# Patient Record
Sex: Male | Born: 1970 | Race: White | Hispanic: No | Marital: Married | Smoking: Former smoker
Health system: Southern US, Community
[De-identification: ages and names within clinical notes are randomized; demographics above are authoritative.]

## PROBLEM LIST (undated history)

## (undated) DIAGNOSIS — R079 Chest pain, unspecified: Secondary | ICD-10-CM

## (undated) DIAGNOSIS — F329 Major depressive disorder, single episode, unspecified: Secondary | ICD-10-CM

## (undated) DIAGNOSIS — K219 Gastro-esophageal reflux disease without esophagitis: Secondary | ICD-10-CM

## (undated) DIAGNOSIS — R740 Nonspecific elevation of levels of transaminase and lactic acid dehydrogenase [LDH]: Secondary | ICD-10-CM

## (undated) DIAGNOSIS — E785 Hyperlipidemia, unspecified: Secondary | ICD-10-CM

## (undated) DIAGNOSIS — D376 Neoplasm of uncertain behavior of liver, gallbladder and bile ducts: Secondary | ICD-10-CM

## (undated) DIAGNOSIS — F411 Generalized anxiety disorder: Secondary | ICD-10-CM

## (undated) DIAGNOSIS — J309 Allergic rhinitis, unspecified: Secondary | ICD-10-CM

## (undated) DIAGNOSIS — J45909 Unspecified asthma, uncomplicated: Secondary | ICD-10-CM

## (undated) HISTORY — DX: Nonspecific elevation of levels of transaminase and lactic acid dehydrogenase (ldh): R74.0

## (undated) HISTORY — DX: Major depressive disorder, single episode, unspecified: F32.9

## (undated) HISTORY — DX: Generalized anxiety disorder: F41.1

## (undated) HISTORY — DX: Unspecified asthma, uncomplicated: J45.909

## (undated) HISTORY — DX: Gastro-esophageal reflux disease without esophagitis: K21.9

## (undated) HISTORY — DX: Neoplasm of uncertain behavior of liver, gallbladder and bile ducts: D37.6

## (undated) HISTORY — PX: TONSILLECTOMY: SUR1361

## (undated) HISTORY — DX: Chest pain, unspecified: R07.9

## (undated) HISTORY — DX: Hyperlipidemia, unspecified: E78.5

## (undated) HISTORY — DX: Allergic rhinitis, unspecified: J30.9

---

## 2003-12-29 ENCOUNTER — Ambulatory Visit: Payer: Self-pay | Admitting: Pulmonary Disease

## 2003-12-29 ENCOUNTER — Ambulatory Visit (HOSPITAL_BASED_OUTPATIENT_CLINIC_OR_DEPARTMENT_OTHER): Admission: RE | Admit: 2003-12-29 | Discharge: 2003-12-29 | Payer: Self-pay | Admitting: Internal Medicine

## 2004-01-01 ENCOUNTER — Ambulatory Visit: Payer: Self-pay | Admitting: Internal Medicine

## 2004-01-12 ENCOUNTER — Ambulatory Visit: Payer: Self-pay

## 2004-01-29 ENCOUNTER — Ambulatory Visit: Payer: Self-pay | Admitting: Internal Medicine

## 2007-02-25 ENCOUNTER — Ambulatory Visit: Payer: Self-pay | Admitting: Internal Medicine

## 2007-02-25 LAB — CONVERTED CEMR LAB
ALT: 31 units/L (ref 0–53)
AST: 28 units/L (ref 0–37)
Alkaline Phosphatase: 60 units/L (ref 39–117)
Bilirubin, Direct: 0.1 mg/dL (ref 0.0–0.3)
CO2: 30 meq/L (ref 19–32)
Calcium: 9.1 mg/dL (ref 8.4–10.5)
Creatinine, Ser: 0.9 mg/dL (ref 0.4–1.5)
Eosinophils Relative: 4 % (ref 0.0–5.0)
GFR calc Af Amer: 124 mL/min
HCT: 44.8 % (ref 39.0–52.0)
HDL: 38.1 mg/dL — ABNORMAL LOW (ref >39.0)
Hemoglobin: 15.9 g/dL (ref 13.0–17.0)
Leukocytes, UA: NEGATIVE
Nitrite: NEGATIVE
Platelets: 219 10*3/uL (ref 150–400)
RBC: 4.88 M/uL (ref 4.22–5.81)
Sodium: 142 meq/L (ref 135–145)
TSH: 2.69 microintl units/mL (ref 0.35–5.50)
Total Bilirubin: 0.7 mg/dL (ref 0.3–1.2)
Total CHOL/HDL Ratio: 6.9
Total Protein, Urine: NEGATIVE mg/dL
Total Protein: 6.8 g/dL (ref 6.0–8.3)
Triglycerides: 233 mg/dL (ref 0–149)
VLDL: 47 mg/dL — ABNORMAL HIGH (ref 0–40)
WBC: 7.3 10*3/uL (ref 4.5–10.5)

## 2007-02-28 ENCOUNTER — Ambulatory Visit: Payer: Self-pay | Admitting: Internal Medicine

## 2007-02-28 DIAGNOSIS — F3289 Other specified depressive episodes: Secondary | ICD-10-CM

## 2007-02-28 DIAGNOSIS — F411 Generalized anxiety disorder: Secondary | ICD-10-CM

## 2007-02-28 DIAGNOSIS — F329 Major depressive disorder, single episode, unspecified: Secondary | ICD-10-CM | POA: Insufficient documentation

## 2007-02-28 HISTORY — DX: Generalized anxiety disorder: F41.1

## 2007-02-28 HISTORY — DX: Other specified depressive episodes: F32.89

## 2007-02-28 HISTORY — DX: Major depressive disorder, single episode, unspecified: F32.9

## 2008-05-08 ENCOUNTER — Ambulatory Visit: Payer: Self-pay | Admitting: Internal Medicine

## 2008-05-08 DIAGNOSIS — E785 Hyperlipidemia, unspecified: Secondary | ICD-10-CM

## 2008-05-08 HISTORY — DX: Hyperlipidemia, unspecified: E78.5

## 2008-07-24 ENCOUNTER — Ambulatory Visit: Payer: Self-pay | Admitting: Internal Medicine

## 2008-07-24 DIAGNOSIS — R079 Chest pain, unspecified: Secondary | ICD-10-CM

## 2008-07-24 DIAGNOSIS — J45909 Unspecified asthma, uncomplicated: Secondary | ICD-10-CM

## 2008-07-24 DIAGNOSIS — K219 Gastro-esophageal reflux disease without esophagitis: Secondary | ICD-10-CM

## 2008-07-24 DIAGNOSIS — J309 Allergic rhinitis, unspecified: Secondary | ICD-10-CM

## 2008-07-24 HISTORY — DX: Chest pain, unspecified: R07.9

## 2008-07-24 HISTORY — DX: Unspecified asthma, uncomplicated: J45.909

## 2008-07-24 HISTORY — DX: Allergic rhinitis, unspecified: J30.9

## 2008-07-24 HISTORY — DX: Gastro-esophageal reflux disease without esophagitis: K21.9

## 2008-07-24 LAB — CONVERTED CEMR LAB
Calcium: 9.3 mg/dL (ref 8.4–10.5)
Cholesterol: 239 mg/dL — ABNORMAL HIGH (ref 0–200)
Eosinophils Relative: 2.6 % (ref 0.0–5.0)
GFR calc non Af Amer: 116.61 mL/min (ref >60)
HDL: 45 mg/dL (ref >39.00)
Lymphocytes Relative: 33.5 % (ref 12.0–46.0)
Lymphs Abs: 1.9 10*3/uL (ref 0.7–4.0)
MCV: 92.1 fL (ref 78.0–100.0)
Monocytes Absolute: 0.7 10*3/uL (ref 0.1–1.0)
Neutro Abs: 3 10*3/uL (ref 1.4–7.7)
RBC: 4.98 M/uL (ref 4.22–5.81)
Sodium: 141 meq/L (ref 135–145)
Specific Gravity, Urine: >=1.03 (ref 1.000–1.030)
TSH: 1.49 microintl units/mL (ref 0.35–5.50)
Total Protein: 7.2 g/dL (ref 6.0–8.3)
Urine Glucose: NEGATIVE mg/dL
VLDL: 16 mg/dL (ref 0.0–40.0)
WBC: 5.8 10*3/uL (ref 4.5–10.5)
pH: 5.5 (ref 5.0–8.0)

## 2009-04-12 ENCOUNTER — Telehealth: Payer: Self-pay | Admitting: Internal Medicine

## 2009-04-15 ENCOUNTER — Telehealth: Payer: Self-pay | Admitting: Internal Medicine

## 2009-05-14 ENCOUNTER — Ambulatory Visit: Payer: Self-pay | Admitting: Internal Medicine

## 2009-05-14 LAB — CONVERTED CEMR LAB
ALT: 104 units/L — ABNORMAL HIGH (ref 0–53)
Alkaline Phosphatase: 58 units/L (ref 39–117)
Basophils Absolute: 0 10*3/uL (ref 0.0–0.1)
Bilirubin Urine: NEGATIVE
Chloride: 108 meq/L (ref 96–112)
Creatinine, Ser: 0.8 mg/dL (ref 0.4–1.5)
Direct LDL: 213.8 mg/dL
Glucose, Bld: 96 mg/dL (ref 70–99)
HDL: 43 mg/dL (ref >39.00)
Hemoglobin, Urine: NEGATIVE
Hemoglobin: 15.7 g/dL (ref 13.0–17.0)
Ketones, ur: NEGATIVE mg/dL
Leukocytes, UA: NEGATIVE
MCV: 94.8 fL (ref 78.0–100.0)
Nitrite: NEGATIVE
Potassium: 4.2 meq/L (ref 3.5–5.1)
RDW: 12.1 % (ref 11.5–14.6)
Sodium: 142 meq/L (ref 135–145)
Specific Gravity, Urine: 1.025 (ref 1.000–1.030)
Total Protein, Urine: NEGATIVE mg/dL
Total Protein: 7.7 g/dL (ref 6.0–8.3)
Triglycerides: 164 mg/dL — ABNORMAL HIGH (ref 0.0–149.0)
Urine Glucose: NEGATIVE mg/dL
Urobilinogen, UA: 0.2 (ref 0.0–1.0)
VLDL: 32.8 mg/dL (ref 0.0–40.0)
WBC: 6.1 10*3/uL (ref 4.5–10.5)
pH: 5 (ref 5.0–8.0)

## 2009-05-17 LAB — CONVERTED CEMR LAB
Hep B C IgM: NEGATIVE
Hepatitis B Surface Ag: NEGATIVE

## 2009-05-18 ENCOUNTER — Ambulatory Visit: Payer: Self-pay | Admitting: Internal Medicine

## 2009-05-18 DIAGNOSIS — R740 Nonspecific elevation of levels of transaminase and lactic acid dehydrogenase [LDH]: Secondary | ICD-10-CM

## 2009-05-18 DIAGNOSIS — R7402 Elevation of levels of lactic acid dehydrogenase (LDH): Secondary | ICD-10-CM

## 2009-05-18 DIAGNOSIS — R7401 Elevation of levels of liver transaminase levels: Secondary | ICD-10-CM

## 2009-05-18 HISTORY — DX: Elevation of levels of lactic acid dehydrogenase (LDH): R74.02

## 2009-05-18 HISTORY — DX: Elevation of levels of liver transaminase levels: R74.01

## 2009-05-25 ENCOUNTER — Encounter: Payer: Self-pay | Admitting: Internal Medicine

## 2009-05-25 ENCOUNTER — Encounter: Admission: RE | Admit: 2009-05-25 | Discharge: 2009-05-25 | Payer: Self-pay | Admitting: Internal Medicine

## 2009-05-25 DIAGNOSIS — D376 Neoplasm of uncertain behavior of liver, gallbladder and bile ducts: Secondary | ICD-10-CM

## 2009-05-25 HISTORY — DX: Neoplasm of uncertain behavior of liver, gallbladder and bile ducts: D37.6

## 2009-06-23 ENCOUNTER — Telehealth: Payer: Self-pay | Admitting: Internal Medicine

## 2009-06-23 ENCOUNTER — Ambulatory Visit: Payer: Self-pay | Admitting: Internal Medicine

## 2009-06-23 LAB — CONVERTED CEMR LAB
LDL Cholesterol: 142 mg/dL — ABNORMAL HIGH (ref 0–99)
Total Bilirubin: 0.8 mg/dL (ref 0.3–1.2)
Total CHOL/HDL Ratio: 5
Triglycerides: 101 mg/dL (ref 0.0–149.0)

## 2009-07-12 ENCOUNTER — Ambulatory Visit: Payer: Self-pay | Admitting: Internal Medicine

## 2010-03-13 ENCOUNTER — Encounter: Payer: Self-pay | Admitting: Internal Medicine

## 2010-03-22 NOTE — Progress Notes (Signed)
Summary: REQ FOR RX  Phone Note Call from Patient Call back at Home Phone [phone]   Summary of Call: Patient is requesting rx for "jitters" for trip. He will be flying out Tuesday.  Initial call taken by: Lamar Sprinkles, CMA,  April 12, 2009 6:18 PM  Follow-up for Phone Call        I am unable to help as I am out of the office for this type of problem;  please forward to MD in the office next available Follow-up by: Corwin Levins MD,  April 12, 2009 6:29 PM  Additional Follow-up for Phone Call Additional follow up Details #1::        i can write for limited Klonopin if pt still able to pick up at office today - please check with him now & let him know to come by if he has time this AM to get rx - thanks! Additional Follow-up by: Newt Lukes MD,  April 13, 2009 8:48 AM    Additional Follow-up for Phone Call Additional follow up Details #2::    left mess to call office back.....................Marland KitchenLamar Sprinkles, CMA  April 13, 2009 12:14 PM   No return call, pt was going to fly out today. Closing phone note, will address if pt calls back.  Follow-up by: Lamar Sprinkles, CMA,  April 13, 2009 5:16 PM

## 2010-03-22 NOTE — Progress Notes (Signed)
Summary: pt?  Phone Note Call from Patient Call back at Home Phone [phone]   Caller: Patient Summary of Call: pt called stating that he receive JWJ message on PT. Pt would like to know if his liver function is good enough for him to "drink a couple beers without worring?". please advise Initial call taken by: Margaret Pyle, CMA,  Jun 23, 2009 2:35 PM  Follow-up for Phone Call        sure, no problem with this, but always drink in moderation Follow-up by: Corwin Levins MD,  Jun 23, 2009 3:22 PM  Additional Follow-up for Phone Call Additional follow up Details #1::        pt informed and Labs mailed per pt request Additional Follow-up by: Margaret Pyle, CMA,  Jun 23, 2009 3:51 PM

## 2010-03-22 NOTE — Assessment & Plan Note (Signed)
Summary: PER DAHLIA SCHED--PER PT D/T--CHEST PAIN--STC   Vital Signs:  Patient profile:   40 year old male Height:      73 inches Weight:      250 pounds BMI:     33.10 O2 Sat:      98 % on Room air Temp:     97.7 degrees F oral Pulse rate:   69 / minute BP sitting:   122 / 88  (left arm) Cuff size:   large  Vitals Entered ByZella Ball Ewing (Jul 12, 2009 1:34 PM)  O2 Flow:  Room air CC: Chest pain/RE   CC:  Chest pain/RE.  History of Present Illness: here with sister in law; Pt has been trying to reduce his paxil from his chronic 10 mg to 5 mg since march 2011;  unfortunately with increased anxiety, chest "fullness" and diffuse pressure with shaking, tremulous, and although had no side effect with the 10 mg that concerned him, seems unable to decide to increase the paxil back to the 10 mg;  requiring lots of family reassurance, near severe symptom today and requiring lots of persistent family reassurance today;  denies worsening depressive symptoms or suicidal ideation; has not had "full blown" panic attack enought to go to ER; doesnot feel he needs counseling at this time.    Pt denies other CP, cough, fever , sob, doe, wheezing, orthopnea, pnd, worsening LE edema, palps, dizziness or syncope  - walks 4 miles per day;  does ellipitical to Heart rate  160 for 45 min to  1 HR without difficulty;  still hard to lose wt.  Pt denies new neuro symptoms such as headache, facial or extremity weakness Overall o/w good compliance with meds, good tolerance.    Problems Prior to Update: 1)  Liver Mass  (ICD-235.3) 2)  Transaminases, Serum, Elevated  (ICD-790.4) 3)  Gerd  (ICD-530.81) 4)  Asthma  (ICD-493.90) 5)  Allergic Rhinitis  (ICD-477.9) 6)  Chest Pain  (ICD-786.50) 7)  Preventive Health Care  (ICD-V70.0) 8)  Hyperlipidemia  (ICD-272.4) 9)  Preventive Health Care  (ICD-V70.0) 10)  Depression  (ICD-311) 11)  Anxiety  (ICD-300.00) 12)  Routine General Medical Exam@health  Care Facl   (ICD-V70.0)  Medications Prior to Update: 1)  Paroxetine Hcl 10 Mg Tabs (Paroxetine Hcl) .Marland Kitchen.. 1 By Mouth Once Daily 2)  Clonazepam 0.5 Mg Tabs (Clonazepam) .Marland Kitchen.. 1 By Mouth Two Times A Day As Needed 3)  Lipitor 20 Mg Tabs (Atorvastatin Calcium) .Marland Kitchen.. 1po Once Daily  Current Medications (verified): 1)  Paroxetine Hcl 10 Mg Tabs (Paroxetine Hcl) .Marland Kitchen.. 1 By Mouth Once Daily 2)  Clonazepam 0.5 Mg Tabs (Clonazepam) .Marland Kitchen.. 1 By Mouth Two Times A Day As Needed 3)  Lipitor 20 Mg Tabs (Atorvastatin Calcium) .Marland Kitchen.. 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   Head:  normocephalic and atraumatic.   Eyes:  vision grossly intact, pupils equal, and pupils round.   Ears:  R ear normal and L ear normal.   Nose:  no external deformity and no nasal discharge.   Mouth:  no gingival abnormalities and pharynx pink and moist.   Neck:  supple and no masses.   Lungs:  normal respiratory effort and normal breath sounds.   Heart:  normal rate and regular rhythm.   Abdomen:  soft, non-tender, and normal bowel sounds.   Extremities:  no edema, no erythema    Impression & Recommendations:  Problem # 1:  CHEST PAIN (ICD-786.50) doubt cardiac,  will check cxr ,  but most likely related to MSK and/or anxiety;  for tylenol as needed  Orders: EKG w/ Interpretation (93000) T-2 View CXR, Same Day  (71020.5TC)  Problem # 2:  ANXIETY (ICD-300.00)  His updated medication list for this problem includes:    Paroxetine Hcl 10 Mg Tabs (Paroxetine hcl) .Marland Kitchen... 1 by mouth once daily    Clonazepam 0.5 Mg Tabs (Clonazepam) .Marland Kitchen... 1 by mouth two times a day as needed pt strongly encouraged to change back to the 10 mg paxil, to which he agrees;  treat as above, f/u any worsening signs or symptoms , consider counseling or psychiatry   Problem # 3:  ASTHMA (ICD-493.90) stable overall by hx and exam, ok to continue meds/tx as is  - does not require med at this time  Complete Medication List: 1)  Paroxetine Hcl 10 Mg Tabs (Paroxetine hcl) .Marland Kitchen.. 1 by mouth once daily 2)  Clonazepam 0.5 Mg Tabs (Clonazepam) .Marland Kitchen.. 1 by mouth two times a day as needed 3)  Lipitor 20 Mg Tabs (Atorvastatin calcium) .Marland Kitchen.. 1po once daily  Patient Instructions: 1)  Your EKG was normal today 2)  Please go to Radiology in the basement level for your X-Ray today  3)  increase the paxil to 10 mg per day 4)  Please schedule a follow-up appointment as needed.

## 2010-03-22 NOTE — Miscellaneous (Signed)
Summary: Orders Update   Clinical Lists Changes  Problems: Added new problem of LIVER MASS (ICD-235.3) Orders: Added new Referral order of Radiology Referral (Radiology) - Signed 

## 2010-03-22 NOTE — Progress Notes (Signed)
Summary: Req for rx  Phone Note Call from Patient Call back at [facility] Phone [phone]   Summary of Call: Pt returned call and req rx for trip again, *see previous phone note*. Left mess for pt to call office back.  Initial call taken by: Lamar Sprinkles, CMA,  April 15, 2009 3:41 PM  Follow-up for Phone Call        Pt has not left town and would like rx called into [facility]. See previous message, Dr Felicity Coyer was going to [facility].  Follow-up by: Lamar Sprinkles, CMA,  April 15, 2009 3:48 PM  Additional Follow-up for Phone Call Additional follow up Details #1::        ok for klonopin - done hardcopy to LIM side B - dahlia  Additional Follow-up by: Corwin Levins MD,  April 15, 2009 5:14 PM    Additional Follow-up for Phone Call Additional follow up Details #2::    rx faxed to pharmacy, pt informed via personal VM Follow-up by: Margaret Pyle, CMA,  April 16, 2009 9:13 AM  New/Updated Medications: CLONAZEPAM 0.5 MG TABS (CLONAZEPAM) 1 by mouth two times a day as needed Prescriptions: CLONAZEPAM 0.5 MG TABS (CLONAZEPAM) 1 by mouth two times a day as needed  #30 x 0   Entered and Authorized by:   Corwin Levins MD   Signed by:   Corwin Levins MD on 04/15/2009   Method used:   Print then Give to Patient   RxID:   [id number]

## 2010-03-22 NOTE — Assessment & Plan Note (Signed)
Summary: CPX / [facility]/ #/ CD   Vital Signs:  Patient profile:   40 year old male Height:      73 inches Weight:      254.25 pounds BMI:     33.67 O2 Sat:      98 % on Room air Temp:     98.3 degrees F oral Pulse rate:   72 / minute BP sitting:   100 / 62  (left arm) Cuff size:   large  Vitals Entered ByZella Ball Ewing (May 18, 2009 10:53 AM)  O2 Flow:  Room air  CC: Adult Physical/RE   CC:  Adult Physical/RE.  History of Present Illness: overall doing well, no compalints, wants to try to wean off the paxil - no depressive symtpom, suicidal ideation and stress level OK now, no panic;  has some fatigue but no OSA symptoms. Pt denies CP, sob, doe, wheezing, orthopnea, pnd, worsening LE edema, palps, dizziness or syncope  Pt denies new neuro symptoms such as headache, facial or extremity weakness   Problems Prior to Update: 1)  Gerd  (ICD-530.81) 2)  Asthma  (ICD-493.90) 3)  Allergic Rhinitis  (ICD-477.9) 4)  Chest Pain  (ICD-786.50) 5)  Cerebrovascular Disease  (ICD-437.9) 6)  Preventive Health Care  (ICD-V70.0) 7)  Hyperlipidemia  (ICD-272.4) 8)  Preventive Health Care  (ICD-V70.0) 9)  Depression  (ICD-311) 10)  Anxiety  (ICD-300.00) 11)  Routine General Medical Exam@health  Care Facl  (ICD-V70.0)  Medications Prior to Update: 1)  Paroxetine Hcl 20 Mg  Tabs (Paroxetine Hcl) .Marland Kitchen.. 1 By Mouth Once Daily 2)  Clonazepam 0.5 Mg Tabs (Clonazepam) .Marland Kitchen.. 1 By Mouth Two Times A Day As Needed  Current Medications (verified): 1)  Paroxetine Hcl 10 Mg Tabs (Paroxetine Hcl) .Marland Kitchen.. 1 By Mouth Once Daily 2)  Clonazepam 0.5 Mg Tabs (Clonazepam) .Marland Kitchen.. 1 By Mouth Two Times A Day As Needed 3)  Lipitor 20 Mg Tabs (Atorvastatin Calcium) .Marland Kitchen.. 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 Anxiety/Panic disorder Depression Hyperlipidemia mild MR by echo 11/05 - EF normal Allergic rhinitis Asthma GERD OSA - mild  Past Surgical History: Reviewed history from 02/28/2007 and no changes required. Tonsillectomy  Review of Systems  The patient denies anorexia, fever, weight loss, vision loss, decreased hearing, hoarseness, chest pain, syncope, dyspnea on exertion, peripheral edema, prolonged cough, headaches, hemoptysis, abdominal pain, melena, hematochezia, severe indigestion/heartburn, hematuria, muscle weakness, suspicious skin lesions, transient blindness, difficulty walking, depression, unusual weight change, abnormal bleeding, enlarged lymph nodes, and angioedema.         all otherwise negative per pt -    Physical Exam  General:  alert and overweight-appearing.   Head:  normocephalic and atraumatic.   Eyes:  vision grossly intact, pupils equal, and pupils round.   Ears:  R ear normal and L ear normal.   Nose:  no external deformity and no nasal discharge.   Mouth:  no gingival abnormalities and pharynx pink and moist.   Neck:  supple and no masses.   Lungs:  normal breath sounds.   Heart:  normal rate and regular rhythm.  Abdomen:  soft, non-tender, and normal bowel sounds.   Msk:  no joint tenderness and no joint swelling.   Extremities:  no edema, no erythema  Neurologic:  cranial nerves II-XII intact and strength normal in all extremities.     Impression & Recommendations:  Problem # 1:  Preventive Health Care (ICD-V70.0)  Overall doing well, age appropriate education and counseling updated and referral for appropriate preventive services done unless declined, immunizations up to date or declined,  diet counseling done if overweight, urged to quit smoking if smokes , most recent labs reviewed and current ordered if appropriate, ecg reviewed or declined (interpretation per ECG scanned in the EMR if done); information regarding Medicare Prevention requirements given if appropriate   Orders: EKG w/ Interpretation (93000)  Problem # 2:  TRANSAMINASES, SERUM, ELEVATED (ICD-790.4)  for u/s f/u, hep serology neg; consider further w/u   Orders: Radiology Referral (Radiology)  Problem # 3:  HYPERLIPIDEMIA (ICD-272.4)  to start the lipitor 20 mg ;  f/u labs 4 wks  His updated medication list for this problem includes:    Lipitor 20 Mg Tabs (Atorvastatin calcium) .Marland Kitchen... 1po once daily  Labs Reviewed: SGOT: 70 (05/14/2009)   SGPT: 104 (05/14/2009)   HDL:43.00 (05/14/2009), 45.00 (07/24/2008)  LDL:DEL (02/25/2007)  Chol:262 (05/14/2009), 239 (07/24/2008)  Trig:164.0 (05/14/2009), 80.0 (07/24/2008)  Problem # 4:  DEPRESSION (ICD-311)  His updated medication list for this problem includes:    Paroxetine Hcl 10 Mg Tabs (Paroxetine hcl) .Marland Kitchen... 1 by mouth once daily    Clonazepam 0.5 Mg Tabs (Clonazepam) .Marland Kitchen... 1 by mouth two times a day as needed improved, wants to try wean off the paxil., Continue all other previous medications as before this visit   Complete Medication List: 1)  Paroxetine Hcl 10 Mg Tabs (Paroxetine hcl) .Marland Kitchen.. 1 by mouth once daily 2)  Clonazepam 0.5 Mg Tabs (Clonazepam) .Marland Kitchen.. 1 by mouth two times a day as needed 3)  Lipitor 20 Mg Tabs (Atorvastatin calcium) .Marland Kitchen.. 1po once daily  Patient Instructions: 1)  You will be contacted about the referral(s) to: ultrasound for the liver 2)  start the liptor at 20 mg per day 3)  please return for lab only in 4 wks;  4)  Hepatic Panel prior to visit, ICD-9: v58.69 5)  Lipid Panel prior to visit, ICD-9: 272.0 6)  Continue all previous medications as before this visit , except you can try to wean off the paxil as you have planned 7)   Please schedule a follow-up appointment in 1 year or sooner if needed Prescriptions: CLONAZEPAM 0.5 MG TABS (CLONAZEPAM) 1 by mouth two times a day as needed  #60 x 2   Entered and Authorized by:   Corwin Levins MD   Signed by:   Corwin Levins MD on 05/18/2009   Method used:   Print then Give to Patient   RxID:   [id number] PAROXETINE HCL 10 MG TABS (PAROXETINE HCL) 1 by mouth once daily  #90 x 3   Entered and Authorized by:   Corwin Levins MD   Signed by:   Corwin Levins MD on 05/18/2009   Method used:   Electronically to        [facility] (retail)       [address]       [place], [place]  [id number]       Ph: [phone] or [phone]       Fax: [phone]   RxID:  [id number] LIPITOR 20 MG TABS (ATORVASTATIN CALCIUM) 1po once daily  #90 x 3   Entered and Authorized by:   Corwin Levins MD   Signed by:   Corwin Levins MD on 05/18/2009   Method used:   Electronically to        [facility] (retail)       [address]       [place], [place]  [id number]       Ph: [phone] or [phone]       Fax: [phone]   RxID:   [id number]

## 2010-05-31 ENCOUNTER — Other Ambulatory Visit: Payer: Self-pay

## 2010-05-31 MED ORDER — ATORVASTATIN CALCIUM 20 MG PO TABS: 20.0000 mg | ORAL_TABLET | Freq: Every day | ORAL | Status: DC

## 2010-05-31 NOTE — Telephone Encounter (Signed)
Pt called requesting refill of Lipitor. Pt advised via VM that CPX will be needed for further refill, only limited supply authorized.

## 2010-06-02 ENCOUNTER — Other Ambulatory Visit: Payer: Self-pay | Admitting: Internal Medicine

## 2010-06-02 NOTE — Telephone Encounter (Signed)
To robin   

## 2010-06-03 MED ORDER — ATORVASTATIN CALCIUM 20 MG PO TABS: 20.0000 mg | ORAL_TABLET | Freq: Every day | ORAL | Status: DC

## 2010-06-03 NOTE — Telephone Encounter (Signed)
Called patient informed of prescription refill and the need to schedule yearly. Patient agreed to schedule appt. And did so. Also patient requested a refill on his Lipitor.

## 2010-06-03 NOTE — Telephone Encounter (Signed)
Ok for refill x 3 mo  Please ask pt to return soon - due for yearly in may 2012

## 2010-07-08 NOTE — Procedures (Signed)
NAME:  Sean Johnson, Sean Johnson NO.:  [id number]   MEDICAL RECORD NO.:  [id number]          PATIENT TYPE:  OUT   LOCATION:  SLEEP CENTER                 FACILITY:  [facility]   PHYSICIAN:  Marcelyn Bruins, M.D. [facility] DATE OF BIRTH:  [birth year]   DATE OF STUDY:  12/29/2003                              NOCTURNAL POLYSOMNOGRAM   REFERRING PHYSICIAN:  Dr. Sandrea Hughs.   INDICATION FOR THE STUDY:  Hypersomnia with sleep apnea.   SLEEP ARCHITECTURE:  The patient had a total sleep time of 344 minutes with  a sleep efficiency of 79%.  There was decreased REM and slow wave sleep.  Sleep onset latency was prolonged at 91 minutes, as was REM latency at 212  minutes.   IMPRESSION:  1.  Mild obstructive sleep apnea/hypopnea syndrome with a respiratory      disturbance index of 17 events per hour and oxygen desaturation as low      as 85%.  Events were not positional.  2.  Moderate to loud snoring noted throughout the study.  3.  Occasional premature atrial contraction that was not clinically      significant.  4.  Moderate numbers of leg jerks with very mild sleep disruption.      KC/MEDQ  D:  01/13/2004 16:36:57  T:  01/13/2004 16:57:00  Job:  [id number]

## 2010-08-16 ENCOUNTER — Other Ambulatory Visit: Payer: Self-pay | Admitting: Internal Medicine

## 2010-08-16 ENCOUNTER — Other Ambulatory Visit: Payer: Self-pay

## 2010-08-16 DIAGNOSIS — Z0000 Encounter for general adult medical examination without abnormal findings: Secondary | ICD-10-CM

## 2010-08-18 ENCOUNTER — Other Ambulatory Visit (INDEPENDENT_AMBULATORY_CARE_PROVIDER_SITE_OTHER): Payer: Self-pay

## 2010-08-18 ENCOUNTER — Other Ambulatory Visit: Payer: Self-pay

## 2010-08-18 ENCOUNTER — Other Ambulatory Visit: Payer: Self-pay | Admitting: Internal Medicine

## 2010-08-18 DIAGNOSIS — Z0000 Encounter for general adult medical examination without abnormal findings: Secondary | ICD-10-CM

## 2010-08-18 LAB — LIPID PANEL
Total CHOL/HDL Ratio: 4
Triglycerides: 168 mg/dL — ABNORMAL HIGH (ref 0.0–149.0)

## 2010-08-18 LAB — URINALYSIS
Hgb urine dipstick: NEGATIVE
Ketones, ur: NEGATIVE
Leukocytes, UA: NEGATIVE
Specific Gravity, Urine: 1.02 (ref 1.000–1.030)
Urine Glucose: NEGATIVE
Urobilinogen, UA: 0.2 (ref 0.0–1.0)
pH: 7 (ref 5.0–8.0)

## 2010-08-18 LAB — HEPATIC FUNCTION PANEL
ALT: 57 U/L — ABNORMAL HIGH (ref 0–53)
Alkaline Phosphatase: 67 U/L (ref 39–117)
Bilirubin, Direct: 0.2 mg/dL (ref 0.0–0.3)
Total Protein: 7.6 g/dL (ref 6.0–8.3)

## 2010-08-18 LAB — CBC WITH DIFFERENTIAL/PLATELET
Eosinophils Relative: 1.9 % (ref 0.0–5.0)
HCT: 44.8 % (ref 39.0–52.0)
Lymphocytes Relative: 36.1 % (ref 12.0–46.0)
MCV: 92.4 fl (ref 78.0–100.0)
Monocytes Absolute: 0.7 10*3/uL (ref 0.1–1.0)
Neutro Abs: 3.4 10*3/uL (ref 1.4–7.7)
Neutrophils Relative %: 51.2 % (ref 43.0–77.0)
Platelets: 210 10*3/uL (ref 150.0–400.0)
RDW: 12.7 % (ref 11.5–14.6)
WBC: 6.6 10*3/uL (ref 4.5–10.5)

## 2010-08-18 LAB — BASIC METABOLIC PANEL
CO2: 26 mEq/L (ref 19–32)
GFR: 157.63 mL/min (ref >60.00)

## 2010-08-23 ENCOUNTER — Encounter: Payer: Self-pay | Admitting: Internal Medicine

## 2010-08-23 ENCOUNTER — Ambulatory Visit (INDEPENDENT_AMBULATORY_CARE_PROVIDER_SITE_OTHER): Payer: 59 | Admitting: Internal Medicine

## 2010-08-23 VITALS — BP 112/78 | HR 80 | Temp 98.0°F | Resp 14 | Ht 73.25 in | Wt 249.0 lb

## 2010-08-23 DIAGNOSIS — R209 Unspecified disturbances of skin sensation: Secondary | ICD-10-CM

## 2010-08-23 DIAGNOSIS — R202 Paresthesia of skin: Secondary | ICD-10-CM | POA: Insufficient documentation

## 2010-08-23 DIAGNOSIS — Z136 Encounter for screening for cardiovascular disorders: Secondary | ICD-10-CM

## 2010-08-23 DIAGNOSIS — Z0000 Encounter for general adult medical examination without abnormal findings: Secondary | ICD-10-CM

## 2010-08-23 MED ORDER — PAROXETINE HCL 10 MG PO TABS: 10.0000 mg | ORAL_TABLET | ORAL | Status: DC

## 2010-08-23 MED ORDER — ATORVASTATIN CALCIUM 20 MG PO TABS: 20.0000 mg | ORAL_TABLET | Freq: Every day | ORAL | Status: DC

## 2010-08-23 NOTE — Progress Notes (Signed)
Subjective:    Patient ID: Sean Johnson, male    DOB: Jan 20, 1971, 40 y.o.   MRN: [id number]  HPI  Here for wellness and f/u;  Overall doing ok;  Pt denies CP, worsening SOB, DOE, wheezing, orthopnea, PND, worsening LE edema, palpitations, dizziness or syncope.  Pt denies neurological change such as new Headache, facial or extremity weakness.  Pt denies polydipsia, polyuria, or low sugar symptoms. Pt states overall good compliance with treatment and medications, good tolerability, and trying to follow lower cholesterol diet.  Pt denies worsening depressive symptoms, suicidal ideation or panic. No fever, wt loss, night sweats, loss of appetite, or other constitutional symptoms.  Pt states good ability with ADL's, low fall risk, home safety reviewed and adequate, no significant changes in hearing or vision, and occasionally active with exercise, too hot to run lately.  C/o LUE pain, mostly at the elbow, some at the shoulder, mild overall, ? Mild worse with sleeping on the left side, ongoing for 6-7 mo, some numbness,, is right handed, no weakeness overall and no neck pain. Past Medical History  Diagnosis Date  . ALLERGIC RHINITIS 07/24/2008  . ANXIETY 02/28/2007  . ASTHMA 07/24/2008  . CHEST PAIN 07/24/2008  . DEPRESSION 02/28/2007  . GERD 07/24/2008  . HYPERLIPIDEMIA 05/08/2008  . LIVER MASS 05/25/2009  . TRANSAMINASES, SERUM, ELEVATED 05/18/2009   Past Surgical History  Procedure Date  . Tonsillectomy     reports that he has quit smoking. He does not have any smokeless tobacco history on file. He reports that he drinks alcohol. His drug history not on file. family history includes Alcohol abuse in his other; Cancer in his mother; Depression in his other; and Stroke in his other. No Known Allergies Current Outpatient Prescriptions on File Prior to Visit  Medication Sig Dispense Refill  . atorvastatin (LIPITOR) 20 MG tablet Take 1 tablet (20 mg total) by mouth daily.  30 tablet  3  . PARoxetine  (PAXIL) 10 MG tablet TAKE ONE TABLET EVERY DAY  31 tablet  3  . clonazePAM (KLONOPIN) 0.5 MG tablet Take 0.5 mg by mouth 2 (two) times daily as needed.         Review of Systems Review of Systems  Constitutional: Negative for diaphoresis, activity change, appetite change and unexpected weight change.  HENT: Negative for hearing loss, ear pain, facial swelling, mouth sores and neck stiffness.   Eyes: Negative for pain, redness and visual disturbance.  Respiratory: Negative for shortness of breath and wheezing.   Cardiovascular: Negative for chest pain and palpitations.  Gastrointestinal: Negative for diarrhea, blood in stool, abdominal distention and rectal pain.  Genitourinary: Negative for hematuria, flank pain and decreased urine volume.  Musculoskeletal: Negative for myalgias and joint swelling.  Skin: Negative for color change and wound.  Neurological: Negative for syncope and numbness.  Hematological: Negative for adenopathy.  Psychiatric/Behavioral: Negative for hallucinations, self-injury, decreased concentration and agitation.      Objective:   Physical Exam BP 112/78  Pulse 80  Temp(Src) 98 F (36.7 C) (Oral)  Resp 14  Ht 6' 1.25" (1.861 m)  Wt 249 lb (112.946 kg)  BMI 32.63 kg/m2  SpO2 97% Physical Exam  VS noted Constitutional: Pt is oriented to person, place, and time. Appears well-developed and well-nourished.  Head: Normocephalic and atraumatic.  Right Ear: External ear normal.  Left Ear: External ear normal.  Nose: Nose normal.  Mouth/Throat: Oropharynx is clear and moist.  Eyes: Conjunctivae and EOM are normal. Pupils are equal,  round, and reactive to light.  Neck: Normal range of motion. Neck supple. No JVD present. No tracheal deviation present.  Cardiovascular: Normal rate, regular rhythm, normal heart sounds and intact distal pulses.   Pulmonary/Chest: Effort normal and breath sounds normal.  Abdominal: Soft. Bowel sounds are normal. There is no  tenderness.  Musculoskeletal: Normal range of motion. Exhibits no edema.  Lymphadenopathy:  Has no cervical adenopathy.  Neurological: Pt is alert and oriented to person, place, and time. Pt has normal reflexes. No cranial nerve deficit.  Skin: Skin is warm and dry. No rash noted.  Psychiatric:  Has  normal mood and affect. Behavior is normal.         Assessment & Plan:

## 2010-08-23 NOTE — Assessment & Plan Note (Signed)

## 2010-08-23 NOTE — Assessment & Plan Note (Signed)
Overall mild, exam ok, ? Mild ulnar neuritis vs CTS - declines other specific eval at this time such as emg/ncs

## 2010-08-23 NOTE — Patient Instructions (Signed)
Continue all other medications as before Please return in 1 year for your yearly visit, or sooner if needed, with Lab testing done 3-5 days before  

## 2011-05-10 IMAGING — US US ABDOMEN COMPLETE
1 series · 13 of 25 positions shown · non-contrast
Comparison: None.

CLINICAL DATA: Elevated liver function tests

ABDOMINAL ULTRASOUND COMPLETE

[Series 1: us abdomen complete · 0.29mm/px · 13 of 81 slices shown]
[im 1/81]
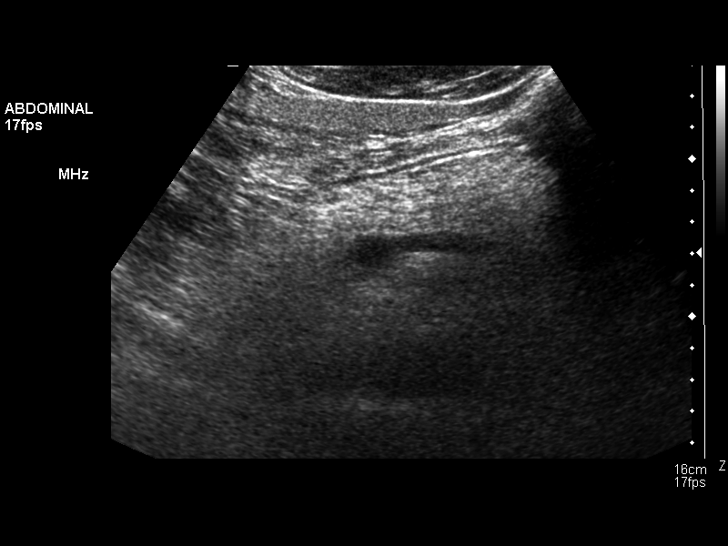
[im 7/81]
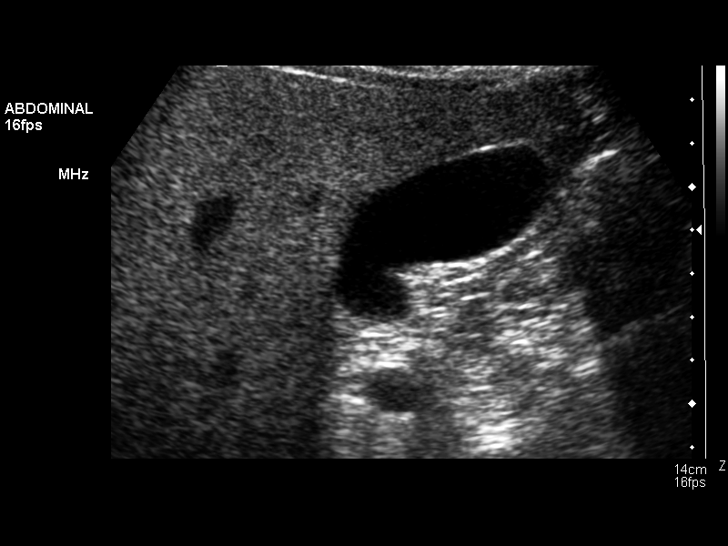
[im 14/81]
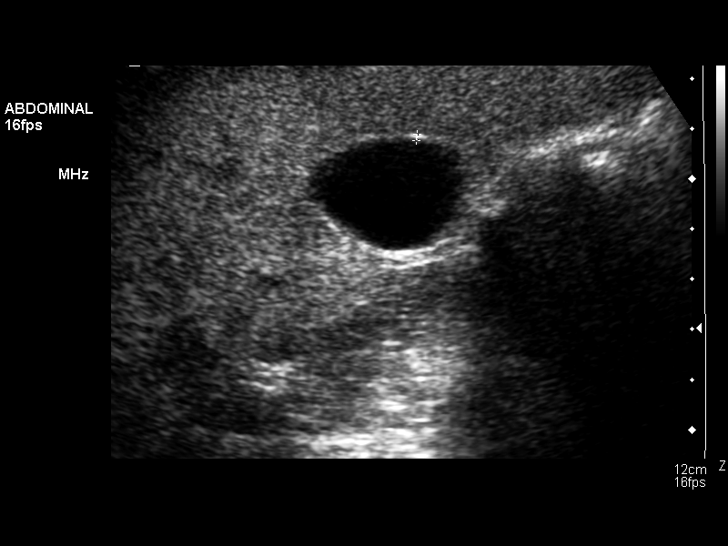
[im 21/81]
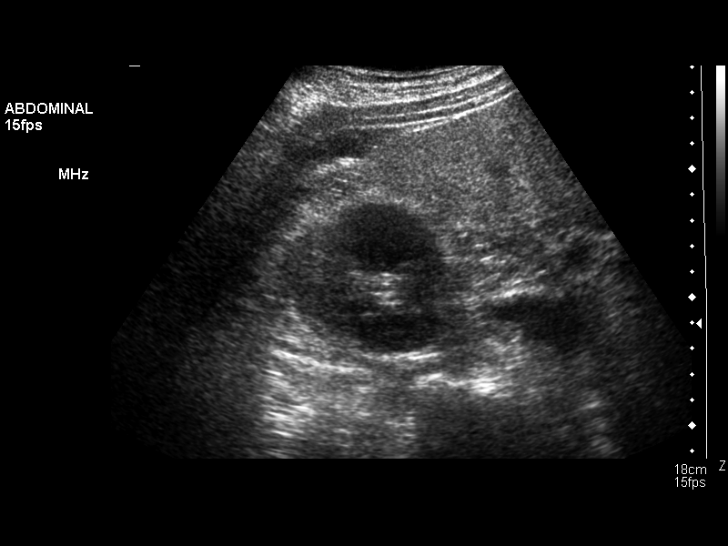
[im 27/81]
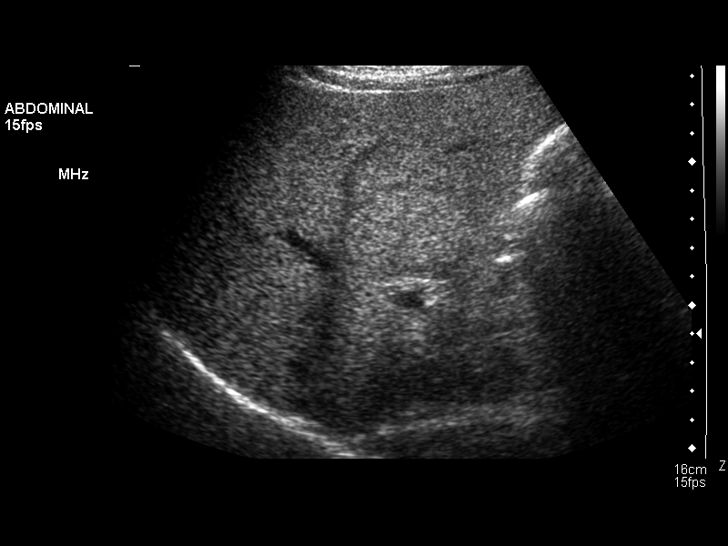
[im 34/81]
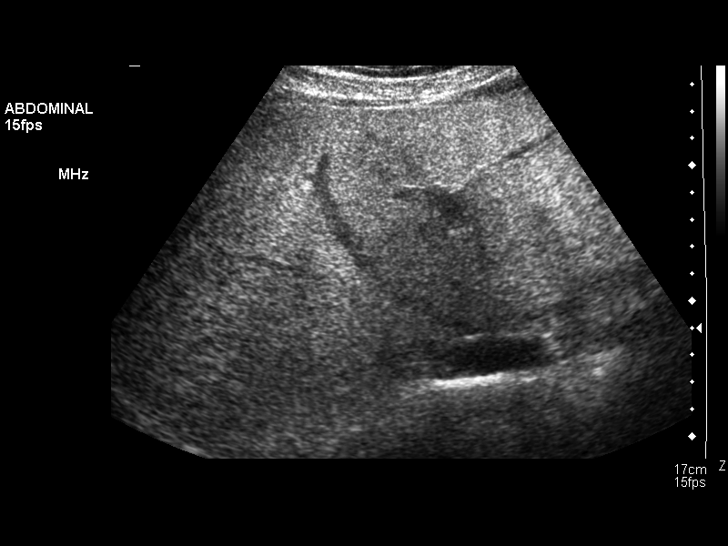
[im 41/81]
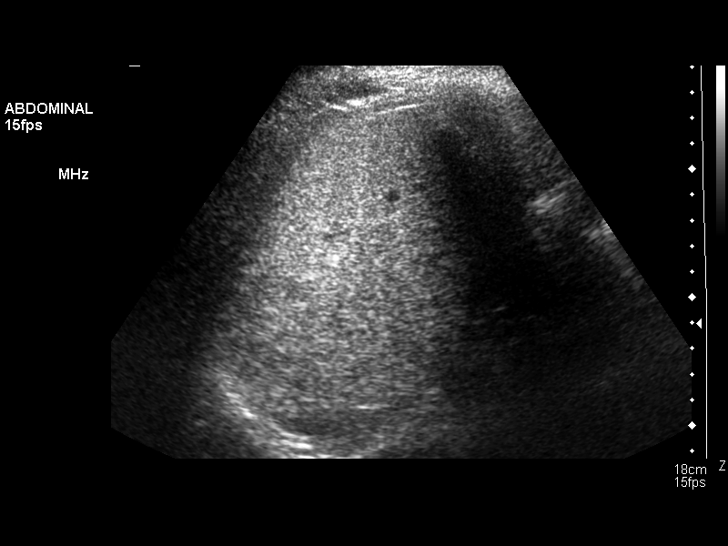
[im 47/81]
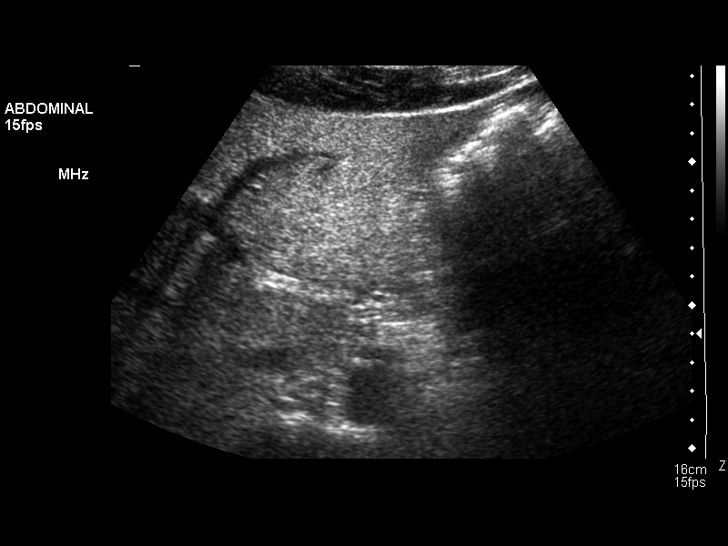
[im 54/81]
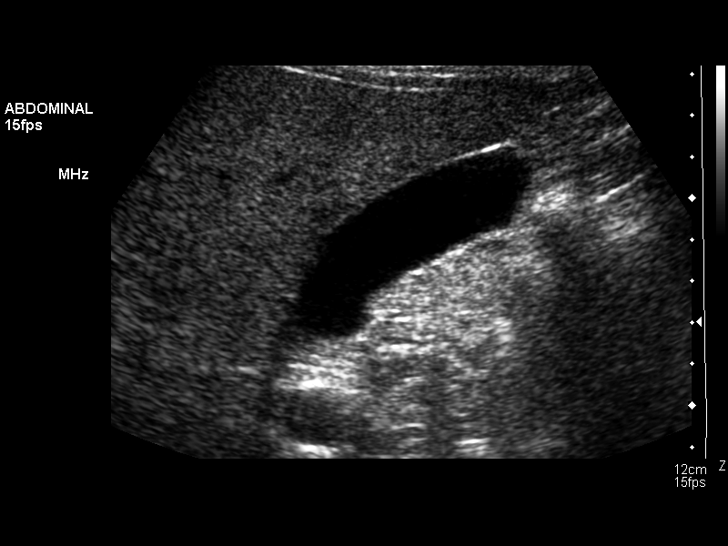
[im 61/81]
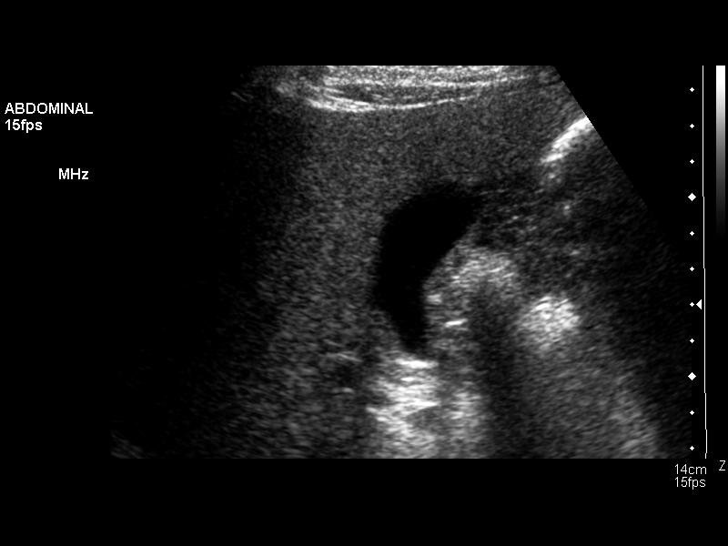
[im 67/81]
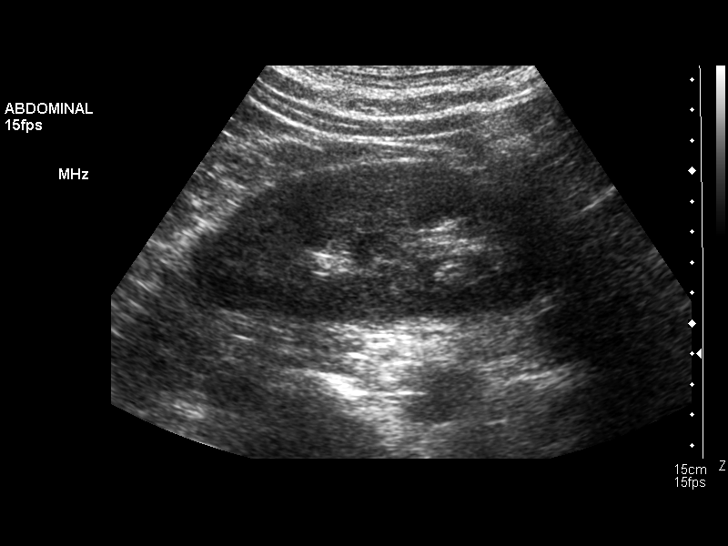
[im 74/81]
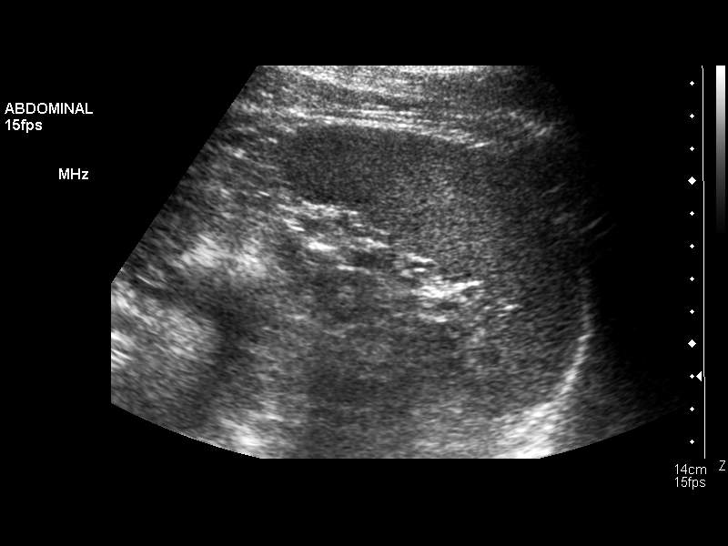
[im 81/81]
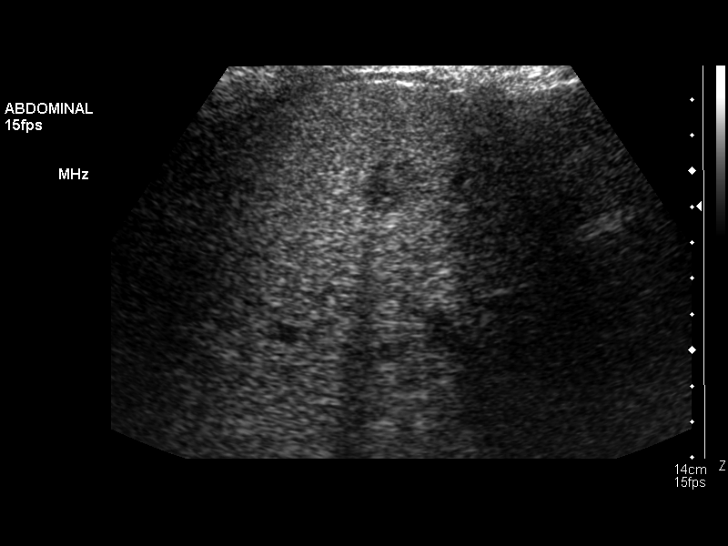

[13 of 25 positions shown; findings below may reference images not displayed]

FINDINGS: Gallbladder:  No gallstones, gallbladder wall thickening, or
pericholecystic fluid.

Common Bile Duct:  Within normal limits in caliber. 3 mm.

Liver:  Diffuse fatty infiltration is present, characterized by
increased echogenicity and attenuation of the ultrasound beam.
Adjacent to the gallbladder, there is a 2.4 x 1.7 x 1.9 cm
hypoechoic lesion with a geographic border.  It is homogeneous.

IVC:  Appears normal.

Pancreas:  No abnormality identified, although entire pancreas
cannot be visualized by ultrasound.

Spleen:  Within normal limits in size and echotexture.

Right kidney:  Normal in size and parenchymal echogenicity.  No
evidence of mass or hydronephrosis.

Left kidney:  Normal in size and parenchymal echogenicity.  No
evidence of mass or hydronephrosis.

Abdominal Aorta:  No aneurysm identified.
IMPRESSION: Fatty liver.

2.4 cm lesion in the liver.  This may simply represent focal
sparing adjacent to the gallbladder within a diffusely fatty liver.
Underlying lesion is not excluded.  If there is strong concern for
malignancy, MRI can be performed.  Otherwise 6-month follow-up
ultrasound is recommended to ensure stability.

## 2011-08-28 ENCOUNTER — Other Ambulatory Visit: Payer: Self-pay | Admitting: Internal Medicine

## 2011-10-04 ENCOUNTER — Telehealth: Payer: Self-pay

## 2011-10-04 DIAGNOSIS — Z0000 Encounter for general adult medical examination without abnormal findings: Secondary | ICD-10-CM

## 2011-10-04 NOTE — Telephone Encounter (Signed)
Put order in for physical labs. 

## 2011-10-17 ENCOUNTER — Other Ambulatory Visit (INDEPENDENT_AMBULATORY_CARE_PROVIDER_SITE_OTHER): Payer: 59

## 2011-10-17 DIAGNOSIS — Z0000 Encounter for general adult medical examination without abnormal findings: Secondary | ICD-10-CM

## 2011-10-17 LAB — URINALYSIS, ROUTINE W REFLEX MICROSCOPIC
Bilirubin Urine: NEGATIVE
Nitrite: NEGATIVE
Specific Gravity, Urine: 1.02 (ref 1.000–1.030)
Total Protein, Urine: NEGATIVE
pH: 5.5 (ref 5.0–8.0)

## 2011-10-17 LAB — CBC WITH DIFFERENTIAL/PLATELET
Eosinophils Relative: 1.9 % (ref 0.0–5.0)
Lymphocytes Relative: 37.3 % (ref 12.0–46.0)
Monocytes Relative: 10.4 % (ref 3.0–12.0)
Neutrophils Relative %: 50.1 % (ref 43.0–77.0)
Platelets: 219 10*3/uL (ref 150.0–400.0)
WBC: 6.8 10*3/uL (ref 4.5–10.5)

## 2011-10-17 LAB — BASIC METABOLIC PANEL
BUN: 10 mg/dL (ref 6–23)
GFR: 148.21 mL/min (ref >60.00)
Glucose, Bld: 75 mg/dL (ref 70–99)
Potassium: 4.1 mEq/L (ref 3.5–5.1)

## 2011-10-17 LAB — HEPATIC FUNCTION PANEL
AST: 55 U/L — ABNORMAL HIGH (ref 0–37)
Albumin: 4.3 g/dL (ref 3.5–5.2)
Total Protein: 7.6 g/dL (ref 6.0–8.3)

## 2011-10-17 LAB — TSH: TSH: 2.08 u[IU]/mL (ref 0.35–5.50)

## 2011-10-17 LAB — LIPID PANEL: HDL: 42.7 mg/dL (ref >39.00)

## 2011-10-20 ENCOUNTER — Encounter: Payer: 59 | Admitting: Internal Medicine

## 2011-11-03 ENCOUNTER — Other Ambulatory Visit: Payer: Self-pay

## 2011-11-03 MED ORDER — PAROXETINE HCL 10 MG PO TABS: 10.0000 mg | ORAL_TABLET | ORAL | Status: DC

## 2011-11-03 MED ORDER — ATORVASTATIN CALCIUM 20 MG PO TABS: 20.0000 mg | ORAL_TABLET | Freq: Every day | ORAL | Status: DC

## 2011-12-26 ENCOUNTER — Ambulatory Visit (INDEPENDENT_AMBULATORY_CARE_PROVIDER_SITE_OTHER): Payer: 59 | Admitting: Internal Medicine

## 2011-12-26 ENCOUNTER — Encounter: Payer: Self-pay | Admitting: Internal Medicine

## 2011-12-26 VITALS — BP 120/82 | HR 76 | Temp 97.6°F | Ht 74.0 in | Wt 246.1 lb

## 2011-12-26 DIAGNOSIS — Z0000 Encounter for general adult medical examination without abnormal findings: Secondary | ICD-10-CM

## 2011-12-26 DIAGNOSIS — Z23 Encounter for immunization: Secondary | ICD-10-CM

## 2011-12-26 MED ORDER — ATORVASTATIN CALCIUM 20 MG PO TABS: 20.0000 mg | ORAL_TABLET | Freq: Every day | ORAL | Status: DC

## 2011-12-26 MED ORDER — PAROXETINE HCL 10 MG PO TABS: 10.0000 mg | ORAL_TABLET | ORAL | Status: DC

## 2011-12-26 NOTE — Assessment & Plan Note (Signed)

## 2011-12-26 NOTE — Patient Instructions (Addendum)
Please call or return if you would lke the flu shot You had the tetanus shot today Please continue your efforts at being more active, low cholesterol diet, and weight control. Continue all other medications as before Please have the pharmacy call with any refills you may need. Please return in 1 year for your yearly visit, or sooner if needed, with Lab testing done 3-5 days before

## 2011-12-26 NOTE — Progress Notes (Signed)
Subjective:    Patient ID: Sean Johnson, male    DOB: 1970-10-22, 41 y.o.   MRN: [id number]  HPI  Here for wellness and f/u;  Overall doing ok;  Pt denies CP, worsening SOB, DOE, wheezing, orthopnea, PND, worsening LE edema, palpitations, dizziness or syncope.  Pt denies neurological change such as new Headache, facial or extremity weakness.  Pt denies polydipsia, polyuria, or low sugar symptoms. Pt states overall good compliance with treatment and medications, good tolerability, and trying to follow lower cholesterol diet.  Pt denies worsening depressive symptoms, suicidal ideation or panic. No fever, wt loss, night sweats, loss of appetite, or other constitutional symptoms.  Pt states good ability with ADL's, low fall risk, home safety reviewed and adequate, no significant changes in hearing or vision, and occasionally active with exercise.  Has had mild URI symptoms with clearish congestion, now improved Past Medical History  Diagnosis Date  . ALLERGIC RHINITIS 07/24/2008  . ANXIETY 02/28/2007  . ASTHMA 07/24/2008  . CHEST PAIN 07/24/2008  . DEPRESSION 02/28/2007  . GERD 07/24/2008  . HYPERLIPIDEMIA 05/08/2008  . LIVER MASS 05/25/2009  . TRANSAMINASES, SERUM, ELEVATED 05/18/2009   Past Surgical History  Procedure Date  . Tonsillectomy     reports that he has quit smoking. He does not have any smokeless tobacco history on file. He reports that he drinks alcohol. His drug history not on file. family history includes Alcohol abuse in his other; Cancer in his mother; Depression in his other; and Stroke in his other. No Known Allergies Current Outpatient Prescriptions on File Prior to Visit  Medication Sig Dispense Refill  . atorvastatin (LIPITOR) 20 MG tablet Take 1 tablet (20 mg total) by mouth daily.  30 tablet  1  . Multiple Vitamins-Minerals (MULTIVITAMIN,TX-MINERALS) tablet Take 1 tablet by mouth daily.        Marland Kitchen PARoxetine (PAXIL) 10 MG tablet Take 1 tablet (10 mg total) by mouth every  morning.  30 tablet  1  . fish oil-omega-3 fatty acids 1000 MG capsule Take 2 g by mouth daily.         Review of Systems Review of Systems  Constitutional: Negative for diaphoresis, activity change, appetite change and unexpected weight change.  HENT: Negative for hearing loss, ear pain, facial swelling, mouth sores and neck stiffness.   Eyes: Negative for pain, redness and visual disturbance.  Respiratory: Negative for shortness of breath and wheezing.   Cardiovascular: Negative for chest pain and palpitations.  Gastrointestinal: Negative for diarrhea, blood in stool, abdominal distention and rectal pain.  Genitourinary: Negative for hematuria, flank pain and decreased urine volume.  Musculoskeletal: Negative for myalgias and joint swelling.  Skin: Negative for color change and wound.  Neurological: Negative for syncope and numbness.  Hematological: Negative for adenopathy.  Psychiatric/Behavioral: Negative for hallucinations, self-injury, decreased concentration and agitation.      Objective:   Physical Exam BP 120/82  Pulse 76  Temp 97.6 F (36.4 C) (Oral)  Ht 6\' 2"  (1.88 m)  Wt 246 lb 2 oz (111.642 kg)  BMI 31.60 kg/m2  SpO2 97% Physical Exam  VS noted Constitutional: Pt is oriented to person, place, and time. Appears well-developed and well-nourished.  HENT:  Head: Normocephalic and atraumatic.  Right Ear: External ear normal.  Left Ear: External ear normal.  Nose: Nose normal.  Mouth/Throat: Oropharynx is clear and moist.  Bilat tm's mild erythema.  Sinus nontender.  Pharynx mild erythema Eyes: Conjunctivae and EOM are normal. Pupils are equal, round,  and reactive to light.  Neck: Normal range of motion. Neck supple. No JVD present. No tracheal deviation present.  Cardiovascular: Normal rate, regular rhythm, normal heart sounds and intact distal pulses.   Pulmonary/Chest: Effort normal and breath sounds normal.  Abdominal: Soft. Bowel sounds are normal. There is no  tenderness.  Musculoskeletal: Normal range of motion. Exhibits no edema.  Lymphadenopathy:  Has no cervical adenopathy.  Neurological: Pt is alert and oriented to person, place, and time. Pt has normal reflexes. No cranial nerve deficit.  Skin: Skin is warm and dry. No rash noted.  Psychiatric:  Has  normal mood and affect. Behavior is normal.     Assessment & Plan:

## 2013-02-22 ENCOUNTER — Other Ambulatory Visit: Payer: Self-pay | Admitting: Internal Medicine

## 2013-02-26 ENCOUNTER — Telehealth: Payer: Self-pay | Admitting: *Deleted

## 2013-02-26 MED ORDER — PAROXETINE HCL 10 MG PO TABS: ORAL_TABLET | ORAL | Status: DC

## 2013-02-26 MED ORDER — ATORVASTATIN CALCIUM 20 MG PO TABS: ORAL_TABLET | ORAL | Status: DC

## 2013-02-26 NOTE — Telephone Encounter (Signed)
Patient phoned, requesting refills for his lipitor and his paxil....has annual exam appointment scheduled with you for 05/21/13.  Please advise.  CB# [phone]

## 2013-02-27 NOTE — Telephone Encounter (Signed)
Rx sent patient notified 

## 2013-03-04 ENCOUNTER — Telehealth: Payer: Self-pay

## 2013-03-04 DIAGNOSIS — Z0000 Encounter for general adult medical examination without abnormal findings: Secondary | ICD-10-CM

## 2013-03-04 NOTE — Telephone Encounter (Signed)
cPX labs entered

## 2013-05-14 ENCOUNTER — Other Ambulatory Visit (INDEPENDENT_AMBULATORY_CARE_PROVIDER_SITE_OTHER): Payer: BC Managed Care – PPO

## 2013-05-14 DIAGNOSIS — Z0000 Encounter for general adult medical examination without abnormal findings: Secondary | ICD-10-CM

## 2013-05-14 LAB — URINALYSIS, ROUTINE W REFLEX MICROSCOPIC
BILIRUBIN URINE: NEGATIVE
Hgb urine dipstick: NEGATIVE
KETONES UR: NEGATIVE
Leukocytes, UA: NEGATIVE
Nitrite: NEGATIVE
RBC / HPF: NONE SEEN (ref 0–?)
Specific Gravity, Urine: 1.015 (ref 1.000–1.030)
Total Protein, Urine: NEGATIVE
URINE GLUCOSE: NEGATIVE
UROBILINOGEN UA: 0.2 (ref 0.0–1.0)
pH: 7 (ref 5.0–8.0)

## 2013-05-14 LAB — BASIC METABOLIC PANEL
BUN: 11 mg/dL (ref 6–23)
CO2: 27 meq/L (ref 19–32)
Calcium: 9.7 mg/dL (ref 8.4–10.5)
Chloride: 105 mEq/L (ref 96–112)
Creatinine, Ser: 0.8 mg/dL (ref 0.4–1.5)
GFR: 122.43 mL/min (ref >60.00)
GLUCOSE: 99 mg/dL (ref 70–99)
Potassium: 4.2 mEq/L (ref 3.5–5.1)
SODIUM: 138 meq/L (ref 135–145)

## 2013-05-14 LAB — LIPID PANEL
Cholesterol: 159 mg/dL (ref 0–200)
HDL: 42.3 mg/dL (ref >39.00)
LDL CALC: 95 mg/dL (ref 0–99)
Total CHOL/HDL Ratio: 4
Triglycerides: 107 mg/dL (ref 0.0–149.0)
VLDL: 21.4 mg/dL (ref 0.0–40.0)

## 2013-05-14 LAB — CBC WITH DIFFERENTIAL/PLATELET
Basophils Absolute: 0 10*3/uL (ref 0.0–0.1)
Basophils Relative: 0.3 % (ref 0.0–3.0)
Eosinophils Absolute: 0.1 10*3/uL (ref 0.0–0.7)
Eosinophils Relative: 1.5 % (ref 0.0–5.0)
HCT: 46.1 % (ref 39.0–52.0)
Hemoglobin: 15.7 g/dL (ref 13.0–17.0)
LYMPHS ABS: 2.5 10*3/uL (ref 0.7–4.0)
Lymphocytes Relative: 35.8 % (ref 12.0–46.0)
MCHC: 34 g/dL (ref 30.0–36.0)
MCV: 92.1 fl (ref 78.0–100.0)
MONO ABS: 0.7 10*3/uL (ref 0.1–1.0)
Monocytes Relative: 9.4 % (ref 3.0–12.0)
NEUTROS PCT: 53 % (ref 43.0–77.0)
Neutro Abs: 3.7 10*3/uL (ref 1.4–7.7)
PLATELETS: 237 10*3/uL (ref 150.0–400.0)
RBC: 5.01 Mil/uL (ref 4.22–5.81)
RDW: 12.9 % (ref 11.5–14.6)
WBC: 7 10*3/uL (ref 4.5–10.5)

## 2013-05-14 LAB — HEPATIC FUNCTION PANEL
ALBUMIN: 4.2 g/dL (ref 3.5–5.2)
ALK PHOS: 60 U/L (ref 39–117)
ALT: 45 U/L (ref 0–53)
AST: 39 U/L — ABNORMAL HIGH (ref 0–37)
Bilirubin, Direct: 0.1 mg/dL (ref 0.0–0.3)
Total Bilirubin: 0.7 mg/dL (ref 0.3–1.2)
Total Protein: 7 g/dL (ref 6.0–8.3)

## 2013-05-14 LAB — PSA: PSA: 1.02 ng/mL (ref 0.10–4.00)

## 2013-05-14 LAB — TSH: TSH: 1.5 u[IU]/mL (ref 0.35–5.50)

## 2013-05-21 ENCOUNTER — Encounter: Payer: Self-pay | Admitting: Internal Medicine

## 2013-05-21 ENCOUNTER — Ambulatory Visit (INDEPENDENT_AMBULATORY_CARE_PROVIDER_SITE_OTHER): Payer: BC Managed Care – PPO | Admitting: Internal Medicine

## 2013-05-21 VITALS — BP 112/82 | HR 67 | Temp 97.4°F | Ht 73.0 in | Wt 240.5 lb

## 2013-05-21 DIAGNOSIS — Z0000 Encounter for general adult medical examination without abnormal findings: Secondary | ICD-10-CM

## 2013-05-21 MED ORDER — ATORVASTATIN CALCIUM 20 MG PO TABS: ORAL_TABLET | ORAL | Status: DC

## 2013-05-21 MED ORDER — PAROXETINE HCL 10 MG PO TABS: ORAL_TABLET | ORAL | Status: DC

## 2013-05-21 NOTE — Progress Notes (Signed)
Pre visit review using our clinic review tool, if applicable. No additional management support is needed unless otherwise documented below in the visit note. 

## 2013-05-21 NOTE — Patient Instructions (Signed)
Please continue all other medications as before, and refills have been done if requested. Please have the pharmacy call with any other refills you may need.  Please continue your efforts at being more active, low cholesterol diet, and weight control. You are otherwise up to date with prevention measures today.  Please keep your appointments with your specialists as you may have planned  Please see Medical Records in the Baseement Level to request any records you may wish to have transferred.  It was a pleasure being involved in your care  Good Luck in [place], and we willl see you back as needed.

## 2013-05-21 NOTE — Progress Notes (Signed)
Subjective:    Patient ID: Sean Johnson, male    DOB: 07-17-1970, 44 y.o.   MRN: [id number]  HPI  Here for wellness and f/u;  Overall doing ok;  Pt denies CP, worsening SOB, DOE, wheezing, orthopnea, PND, worsening LE edema, palpitations, dizziness or syncope.  Pt denies neurological change such as new headache, facial or extremity weakness.  Pt denies polydipsia, polyuria, or low sugar symptoms. Pt states overall good compliance with treatment and medications, good tolerability, and has been trying to follow lower cholesterol diet.  Pt denies worsening depressive symptoms, suicidal ideation or panic. No fever, night sweats, wt loss, loss of appetite, or other constitutional symptoms.  Pt states good ability with ADL's, has low fall risk, home safety reviewed and adequate, no other significant changes in hearing or vision, and only occasionally active with exercise. Now lives in [place], plans to move care there in future due to distance. No complaints.  Has cut back on ETOH use, more exercise and lost several lbs working on his health more in the past yr. Past Medical History  Diagnosis Date  . ALLERGIC RHINITIS 07/24/2008  . ANXIETY 02/28/2007  . ASTHMA 07/24/2008  . CHEST PAIN 07/24/2008  . DEPRESSION 02/28/2007  . GERD 07/24/2008  . HYPERLIPIDEMIA 05/08/2008  . LIVER MASS 05/25/2009  . TRANSAMINASES, SERUM, ELEVATED 05/18/2009   Past Surgical History  Procedure Laterality Date  . Tonsillectomy      reports that he has quit smoking. He does not have any smokeless tobacco history on file. He reports that he drinks alcohol. His drug history is not on file. family history includes Alcohol abuse in his other; Cancer in his mother; Depression in his other; Stroke in his other. No Known Allergies Current Outpatient Prescriptions on File Prior to Visit  Medication Sig Dispense Refill  . fish oil-omega-3 fatty acids 1000 MG capsule Take 2 g by mouth daily.        . Multiple Vitamins-Minerals  (MULTIVITAMIN,TX-MINERALS) tablet Take 1 tablet by mouth daily.        Marland Kitchen PARoxetine (PAXIL) 10 MG tablet TAKE ONE TABLET BY MOUTH EVERY MORNING  90 tablet  0   No current facility-administered medications on file prior to visit.   Review of Systems Constitutional: Negative for diaphoresis, activity change, appetite change or unexpected weight change.  HENT: Negative for hearing loss, ear pain, facial swelling, mouth sores and neck stiffness.   Eyes: Negative for pain, redness and visual disturbance.  Respiratory: Negative for shortness of breath and wheezing.   Cardiovascular: Negative for chest pain and palpitations.  Gastrointestinal: Negative for diarrhea, blood in stool, abdominal distention or other pain Genitourinary: Negative for hematuria, flank pain or change in urine volume.  Musculoskeletal: Negative for myalgias and joint swelling.  Skin: Negative for color change and wound.  Neurological: Negative for syncope and numbness. other than noted Hematological: Negative for adenopathy.  Psychiatric/Behavioral: Negative for hallucinations, self-injury, decreased concentration and agitation.      Objective:   Physical Exam BP 112/82  Pulse 67  Temp(Src) 97.4 F (36.3 C) (Oral)  Ht 6\' 1"  (1.854 m)  Wt 240 lb 8 oz (109.09 kg)  BMI 31.74 kg/m2  SpO2 98% VS noted,  Constitutional: Pt is oriented to person, place, and time. Appears well-developed and well-nourished.  Head: Normocephalic and atraumatic.  Right Ear: External ear normal.  Left Ear: External ear normal.  Nose: Nose normal.  Mouth/Throat: Oropharynx is clear and moist.  Eyes: Conjunctivae and EOM are  normal. Pupils are equal, round, and reactive to light.  Neck: Normal range of motion. Neck supple. No JVD present. No tracheal deviation present.  Cardiovascular: Normal rate, regular rhythm, normal heart sounds and intact distal pulses.   Pulmonary/Chest: Effort normal and breath sounds normal.  Abdominal: Soft.  Bowel sounds are normal. There is no tenderness. No HSM  Musculoskeletal: Normal range of motion. Exhibits no edema.  Lymphadenopathy:  Has no cervical adenopathy.  Neurological: Pt is alert and oriented to person, place, and time. Pt has normal reflexes. No cranial nerve deficit.  Skin: Skin is warm and dry. No rash noted.  Psychiatric:  Has  normal mood and affect. Behavior is normal.     Assessment & Plan:

## 2013-05-21 NOTE — Assessment & Plan Note (Signed)

## 2014-05-21 ENCOUNTER — Other Ambulatory Visit: Payer: Self-pay | Admitting: Internal Medicine

## 2014-08-25 ENCOUNTER — Other Ambulatory Visit: Payer: Self-pay | Admitting: Internal Medicine

## 2014-08-26 ENCOUNTER — Other Ambulatory Visit: Payer: Self-pay | Admitting: Internal Medicine

## 2014-09-02 ENCOUNTER — Other Ambulatory Visit: Payer: Self-pay | Admitting: Internal Medicine

## 2014-09-17 ENCOUNTER — Ambulatory Visit: Payer: Self-pay | Admitting: Internal Medicine

## 2014-09-19 ENCOUNTER — Other Ambulatory Visit: Payer: Self-pay | Admitting: Internal Medicine

## 2014-09-25 ENCOUNTER — Encounter: Payer: Self-pay | Admitting: Internal Medicine

## 2014-09-25 ENCOUNTER — Other Ambulatory Visit (INDEPENDENT_AMBULATORY_CARE_PROVIDER_SITE_OTHER): Payer: BLUE CROSS/BLUE SHIELD

## 2014-09-25 ENCOUNTER — Ambulatory Visit (INDEPENDENT_AMBULATORY_CARE_PROVIDER_SITE_OTHER): Payer: BLUE CROSS/BLUE SHIELD | Admitting: Internal Medicine

## 2014-09-25 VITALS — BP 120/80 | HR 70 | Temp 98.0°F | Ht 73.0 in | Wt 255.0 lb

## 2014-09-25 DIAGNOSIS — Z0000 Encounter for general adult medical examination without abnormal findings: Secondary | ICD-10-CM | POA: Diagnosis not present

## 2014-09-25 LAB — URINALYSIS, ROUTINE W REFLEX MICROSCOPIC
Bilirubin Urine: NEGATIVE
Hgb urine dipstick: NEGATIVE
Ketones, ur: NEGATIVE
LEUKOCYTES UA: NEGATIVE
NITRITE: NEGATIVE
RBC / HPF: NONE SEEN (ref 0–?)
Specific Gravity, Urine: >=1.03 — AB (ref 1.000–1.030)
Total Protein, Urine: NEGATIVE
Urine Glucose: NEGATIVE
Urobilinogen, UA: 0.2 (ref 0.0–1.0)
WBC, UA: NONE SEEN (ref 0–?)
pH: 5.5 (ref 5.0–8.0)

## 2014-09-25 LAB — CBC WITH DIFFERENTIAL/PLATELET
BASOS ABS: 0 10*3/uL (ref 0.0–0.1)
Basophils Relative: 0.4 % (ref 0.0–3.0)
EOS ABS: 0.1 10*3/uL (ref 0.0–0.7)
Eosinophils Relative: 1.1 % (ref 0.0–5.0)
HCT: 43.1 % (ref 39.0–52.0)
Hemoglobin: 15 g/dL (ref 13.0–17.0)
LYMPHS ABS: 3 10*3/uL (ref 0.7–4.0)
Lymphocytes Relative: 32.4 % (ref 12.0–46.0)
MCHC: 34.8 g/dL (ref 30.0–36.0)
MCV: 90.9 fl (ref 78.0–100.0)
MONO ABS: 0.8 10*3/uL (ref 0.1–1.0)
MONOS PCT: 8.9 % (ref 3.0–12.0)
Neutro Abs: 5.2 10*3/uL (ref 1.4–7.7)
Neutrophils Relative %: 57.2 % (ref 43.0–77.0)
Platelets: 222 10*3/uL (ref 150.0–400.0)
RBC: 4.74 Mil/uL (ref 4.22–5.81)
RDW: 12.9 % (ref 11.5–15.5)
WBC: 9.2 10*3/uL (ref 4.0–10.5)

## 2014-09-25 LAB — BASIC METABOLIC PANEL
BUN: 11 mg/dL (ref 6–23)
CALCIUM: 9.4 mg/dL (ref 8.4–10.5)
CO2: 26 mEq/L (ref 19–32)
Chloride: 104 mEq/L (ref 96–112)
Creatinine, Ser: 0.75 mg/dL (ref 0.40–1.50)
GFR: 121.6 mL/min (ref >60.00)
Glucose, Bld: 82 mg/dL (ref 70–99)
POTASSIUM: 3.7 meq/L (ref 3.5–5.1)
SODIUM: 138 meq/L (ref 135–145)

## 2014-09-25 LAB — LIPID PANEL
CHOLESTEROL: 179 mg/dL (ref 0–200)
HDL: 41.8 mg/dL (ref >39.00)
LDL Cholesterol: 104 mg/dL — ABNORMAL HIGH (ref 0–99)
NonHDL: 137.24
Total CHOL/HDL Ratio: 4
Triglycerides: 165 mg/dL — ABNORMAL HIGH (ref 0.0–149.0)
VLDL: 33 mg/dL (ref 0.0–40.0)

## 2014-09-25 LAB — HEPATIC FUNCTION PANEL
ALK PHOS: 62 U/L (ref 39–117)
ALT: 38 U/L (ref 0–53)
AST: 34 U/L (ref 0–37)
Albumin: 4.3 g/dL (ref 3.5–5.2)
Bilirubin, Direct: 0.1 mg/dL (ref 0.0–0.3)
TOTAL PROTEIN: 7.5 g/dL (ref 6.0–8.3)
Total Bilirubin: 0.5 mg/dL (ref 0.2–1.2)

## 2014-09-25 MED ORDER — PAROXETINE HCL 10 MG PO TABS: 10.0000 mg | ORAL_TABLET | Freq: Every morning | ORAL | Status: DC

## 2014-09-25 MED ORDER — ATORVASTATIN CALCIUM 20 MG PO TABS: ORAL_TABLET | ORAL | Status: DC

## 2014-09-25 NOTE — Patient Instructions (Signed)

## 2014-09-25 NOTE — Assessment & Plan Note (Signed)

## 2014-09-25 NOTE — Progress Notes (Signed)
Pre visit review using our clinic review tool, if applicable. No additional management support is needed unless otherwise documented below in the visit note. 

## 2014-09-25 NOTE — Progress Notes (Signed)
Subjective:    Patient ID: Sean Johnson, male    DOB: 1970/04/27, 44 y.o.   MRN: [id number]  HPI  Here for wellness and f/u;  Overall doing ok;  Pt denies Chest pain, worsening SOB, DOE, wheezing, orthopnea, PND, worsening LE edema, palpitations, dizziness or syncope.  Pt denies neurological change such as new headache, facial or extremity weakness.  Pt denies polydipsia, polyuria, or low sugar symptoms. Pt states overall good compliance with treatment and medications, good tolerability, and has been trying to follow appropriate diet.  Pt denies worsening depressive symptoms, suicidal ideation or panic. No fever, night sweats, wt loss, loss of appetite, or other constitutional symptoms.  Pt states good ability with ADL's, has low fall risk, home safety reviewed and adequate, no other significant changes in hearing or vision, and only occasionally active with exercise.  Has gained significant wt Wt Readings from Last 3 Encounters:  09/25/14 255 lb (115.667 kg)  05/21/13 240 lb 8 oz (109.09 kg)  12/26/11 246 lb 2 oz (111.642 kg)   BP Readings from Last 3 Encounters:  09/25/14 120/80  05/21/13 112/82  12/26/11 120/82   Past Medical History  Diagnosis Date  . ALLERGIC RHINITIS 07/24/2008  . ANXIETY 02/28/2007  . ASTHMA 07/24/2008  . CHEST PAIN 07/24/2008  . DEPRESSION 02/28/2007  . GERD 07/24/2008  . HYPERLIPIDEMIA 05/08/2008  . LIVER MASS 05/25/2009  . TRANSAMINASES, SERUM, ELEVATED 05/18/2009   Past Surgical History  Procedure Laterality Date  . Tonsillectomy      reports that he has quit smoking. He does not have any smokeless tobacco history on file. He reports that he drinks alcohol. His drug history is not on file. family history includes Alcohol abuse in his other; Cancer in his mother; Depression in his other; Stroke in his other. No Known Allergies Current Outpatient Prescriptions on File Prior to Visit  Medication Sig Dispense Refill  . atorvastatin (LIPITOR) 20 MG tablet TAKE ONE  TABLET BY MOUTH DAILY 90 tablet 3  . fish oil-omega-3 fatty acids 1000 MG capsule Take 2 g by mouth daily.      . Multiple Vitamins-Minerals (MULTIVITAMIN,TX-MINERALS) tablet Take 1 tablet by mouth daily.      Marland Kitchen PARoxetine (PAXIL) 10 MG tablet TAKE 1 TABLET BY MOUTH EVERY MORNING 90 tablet 0   No current facility-administered medications on file prior to visit.   Review of Systems Constitutional: Negative for increased diaphoresis, other activity, appetite or siginficant weight change other than noted HENT: Negative for worsening hearing loss, ear pain, facial swelling, mouth sores and neck stiffness.   Eyes: Negative for other worsening pain, redness or visual disturbance.  Respiratory: Negative for shortness of breath and wheezing  Cardiovascular: Negative for chest pain and palpitations.  Gastrointestinal: Negative for diarrhea, blood in stool, abdominal distention or other pain Genitourinary: Negative for hematuria, flank pain or change in urine volume.  Musculoskeletal: Negative for myalgias or other joint complaints.  Skin: Negative for color change and wound or drainage.  Neurological: Negative for syncope and numbness. other than noted Hematological: Negative for adenopathy. or other swelling Psychiatric/Behavioral: Negative for hallucinations, SI, self-injury, decreased concentration or other worsening agitation.      Objective:   Physical Exam BP 120/80 mmHg  Pulse 70  Temp(Src) 98 F (36.7 C) (Oral)  Ht 6\' 1"  (1.854 m)  Wt 255 lb (115.667 kg)  BMI 33.65 kg/m2  SpO2 99% VS noted,  Constitutional: Pt is oriented to person, place, and time. Appears well-developed and  well-nourished, in no significant distress Head: Normocephalic and atraumatic.  Right Ear: External ear normal.  Left Ear: External ear normal.  Nose: Nose normal.  Mouth/Throat: Oropharynx is clear and moist.  Eyes: Conjunctivae and EOM are normal. Pupils are equal, round, and reactive to light.  Neck:  Normal range of motion. Neck supple. No JVD present. No tracheal deviation present or significant neck LA or mass Cardiovascular: Normal rate, regular rhythm, normal heart sounds and intact distal pulses.   Pulmonary/Chest: Effort normal and breath sounds without rales or wheezing  Abdominal: Soft. Bowel sounds are normal. NT. No HSM  Musculoskeletal: Normal range of motion. Exhibits no edema.  Lymphadenopathy:  Has no cervical adenopathy.  Neurological: Pt is alert and oriented to person, place, and time. Pt has normal reflexes. No cranial nerve deficit. Motor grossly intact Skin: Skin is warm and dry. No rash noted.  Psychiatric:  Has normal mood and affect. Behavior is normal.      Assessment & Plan:

## 2014-09-28 LAB — TSH: TSH: 1.67 u[IU]/mL (ref 0.35–4.50)

## 2014-09-28 LAB — PSA: PSA: 1.08 ng/mL (ref 0.10–4.00)

## 2014-10-01 ENCOUNTER — Encounter: Payer: Self-pay | Admitting: Internal Medicine

## 2014-10-01 ENCOUNTER — Ambulatory Visit (INDEPENDENT_AMBULATORY_CARE_PROVIDER_SITE_OTHER): Payer: BLUE CROSS/BLUE SHIELD | Admitting: Internal Medicine

## 2014-10-01 VITALS — BP 124/84 | HR 81 | Temp 98.6°F | Ht 74.0 in | Wt 250.0 lb

## 2014-10-01 DIAGNOSIS — H6093 Unspecified otitis externa, bilateral: Secondary | ICD-10-CM

## 2014-10-01 DIAGNOSIS — J4520 Mild intermittent asthma, uncomplicated: Secondary | ICD-10-CM

## 2014-10-01 DIAGNOSIS — J309 Allergic rhinitis, unspecified: Secondary | ICD-10-CM

## 2014-10-01 MED ORDER — NEOMYCIN-COLIST-HC-THONZONIUM 3.3-3-10-0.5 MG/ML OT SUSP: 3.0000 [drp] | Freq: Four times a day (QID) | OTIC | Status: DC

## 2014-10-01 MED ORDER — CIPROFLOXACIN HCL 500 MG PO TABS: 500.0000 mg | ORAL_TABLET | Freq: Two times a day (BID) | ORAL | Status: DC

## 2014-10-01 NOTE — Progress Notes (Signed)
Pre visit review using our clinic review tool, if applicable. No additional management support is needed unless otherwise documented below in the visit note. 

## 2014-10-01 NOTE — Patient Instructions (Signed)
Please take all new medication as prescribed - the pill and the drops antibiotics (sent to [facility])  You can take the ibuprofen up to 800 mg four times per day, but cut back or stop if the pain improves in a few days  Please continue all other medications as before, and refills have been done if requested.  Please have the pharmacy call with any other refills you may need.  Please continue your efforts at being more active, low cholesterol diet, and weight control.  Please keep your appointments with your specialists as you may have planned

## 2014-10-02 ENCOUNTER — Telehealth: Payer: Self-pay | Admitting: Internal Medicine

## 2014-10-02 MED ORDER — NEOMYCIN-POLYMYXIN-HC 3.5-10000-1 OT SOLN: 3.0000 [drp] | Freq: Four times a day (QID) | OTIC | Status: AC

## 2014-10-02 NOTE — Telephone Encounter (Signed)
Change of ear drops done

## 2014-10-02 NOTE — Telephone Encounter (Signed)
Pt advised via VM 

## 2014-10-02 NOTE — Telephone Encounter (Signed)
Pt was in yesterday and given a prescription for neomycin-colistin-hydrocortisone-thonzonium (CORTISPORIN-TC) 3.04-22-08-0.5 MG/ML otic suspension [[id number]] . He can't find a pharmacy that carries this. The carry this without the [facility] [address] In [place]

## 2014-10-03 DIAGNOSIS — H6090 Unspecified otitis externa, unspecified ear: Secondary | ICD-10-CM | POA: Insufficient documentation

## 2014-10-03 NOTE — Assessment & Plan Note (Signed)
Mild to mod, for antibx course - otic soln and oral med,  to f/u any worsening symptoms or concerns

## 2014-10-03 NOTE — Progress Notes (Signed)
Subjective:    Patient ID: Sean Johnson, male    DOB: [birth year], 44 y.o.   MRN: [id number]  HPI  Here with c/o 1 wk gradually worsening now severe constant right > left ear pain, tender to touch, no overt d/c , fever, HA, ST, cough, hearing loss and Pt denies chest pain, increased sob or doe, wheezing, orthopnea, PND, increased LE swelling, palpitations, dizziness or syncope.  Pt denies new neurological symptoms such as new headache, or facial or extremity weakness or numbness   Pt denies polydipsia, polyuria  No significant worsening nasal allergy symptoms or mild at worst, controlled with current tx and otc Past Medical History  Diagnosis Date  . ALLERGIC RHINITIS 07/24/2008  . ANXIETY 02/28/2007  . ASTHMA 07/24/2008  . CHEST PAIN 07/24/2008  . DEPRESSION 02/28/2007  . GERD 07/24/2008  . HYPERLIPIDEMIA 05/08/2008  . LIVER MASS 05/25/2009  . TRANSAMINASES, SERUM, ELEVATED 05/18/2009   Past Surgical History  Procedure Laterality Date  . Tonsillectomy      reports that he has quit smoking. He does not have any smokeless tobacco history on file. He reports that he drinks alcohol. His drug history is not on file. family history includes Alcohol abuse in his other; Cancer in his mother; Depression in his other; Stroke in his other. No Known Allergies Current Outpatient Prescriptions on File Prior to Visit  Medication Sig Dispense Refill  . atorvastatin (LIPITOR) 20 MG tablet TAKE ONE TABLET BY MOUTH DAILY 90 tablet 3  . fish oil-omega-3 fatty acids 1000 MG capsule Take 2 g by mouth daily.      . Multiple Vitamins-Minerals (MULTIVITAMIN,TX-MINERALS) tablet Take 1 tablet by mouth daily.      Marland Kitchen PARoxetine (PAXIL) 10 MG tablet Take 1 tablet (10 mg total) by mouth every morning. 90 tablet 3   No current facility-administered medications on file prior to visit.   Review of Systems  Constitutional: Negative for unusual diaphoresis or night sweats HENT: Negative for ringing in ear or  discharge Eyes: Negative for double vision or worsening visual disturbance.  Respiratory: Negative for choking and stridor.   Gastrointestinal: Negative for vomiting or other signifcant bowel change Genitourinary: Negative for hematuria or change in urine volume.  Musculoskeletal: Negative for other MSK pain or swelling Skin: Negative for color change and worsening wound.  Neurological: Negative for tremors and numbness other than noted  Psychiatric/Behavioral: Negative for decreased concentration or agitation other than above       Objective:   Physical Exam BP 124/84 mmHg  Pulse 81  Temp(Src) 98.6 F (37 C) (Oral)  Ht 6\' 2"  (1.88 m)  Wt 250 lb (113.399 kg)  BMI 32.08 kg/m2  SpO2 97% VS noted, mild ill Constitutional: Pt appears in no significant distress HENT: Head: NCAT.  Right Ear: External ear normal.  Left Ear: External ear normal.  Eyes: . Pupils are equal, round, and reactive to light. Conjunctivae and EOM are normal Bilat tm's without erythema but right > left ext canal red/swelling/tender and slight mucous d/c.  Max sinus areas non  tender.  Pharynx with mild erythema, no exudate Neck: Normal range of motion. Neck supple. Has left submandib LA tender below left ear  Cardiovascular: Normal rate and regular rhythm.   Pulmonary/Chest: Effort normal and breath sounds without rales or wheezing.  Neurological: Pt is alert. Not confused , motor grossly intact Skin: Skin is warm. No rash, no LE edema Psychiatric: Pt behavior is normal. No agitation.     Assessment &  Plan:

## 2014-10-03 NOTE — Assessment & Plan Note (Signed)
stable overall by history and exam, recent data reviewed with pt, and pt to continue medical treatment as before,  to f/u any worsening symptoms or concerns SpO2 Readings from Last 3 Encounters:  10/01/14 97%  09/25/14 99%  05/21/13 98%

## 2014-10-03 NOTE — Assessment & Plan Note (Signed)
stable overall by history and exam, and pt to continue medical treatment as before,  to f/u any worsening symptoms or concerns 

## 2015-10-25 ENCOUNTER — Other Ambulatory Visit: Payer: Self-pay | Admitting: Internal Medicine

## 2016-02-08 ENCOUNTER — Ambulatory Visit: Payer: BLUE CROSS/BLUE SHIELD | Admitting: Internal Medicine

## 2016-02-09 ENCOUNTER — Encounter: Payer: Self-pay | Admitting: Internal Medicine

## 2016-02-09 ENCOUNTER — Ambulatory Visit (INDEPENDENT_AMBULATORY_CARE_PROVIDER_SITE_OTHER): Payer: BLUE CROSS/BLUE SHIELD | Admitting: Internal Medicine

## 2016-02-09 ENCOUNTER — Other Ambulatory Visit: Payer: BLUE CROSS/BLUE SHIELD

## 2016-02-09 ENCOUNTER — Other Ambulatory Visit (INDEPENDENT_AMBULATORY_CARE_PROVIDER_SITE_OTHER): Payer: BLUE CROSS/BLUE SHIELD

## 2016-02-09 VITALS — BP 128/76 | HR 72 | Temp 98.2°F | Resp 20 | Wt 249.0 lb

## 2016-02-09 DIAGNOSIS — H6983 Other specified disorders of Eustachian tube, bilateral: Secondary | ICD-10-CM

## 2016-02-09 DIAGNOSIS — Z0000 Encounter for general adult medical examination without abnormal findings: Secondary | ICD-10-CM | POA: Diagnosis not present

## 2016-02-09 LAB — CBC WITH DIFFERENTIAL/PLATELET
BASOS ABS: 0 10*3/uL (ref 0.0–0.1)
Basophils Relative: 0.6 % (ref 0.0–3.0)
Eosinophils Absolute: 0.2 10*3/uL (ref 0.0–0.7)
Eosinophils Relative: 1.8 % (ref 0.0–5.0)
HCT: 45.1 % (ref 39.0–52.0)
Hemoglobin: 15.8 g/dL (ref 13.0–17.0)
Lymphocytes Relative: 34.7 % (ref 12.0–46.0)
Lymphs Abs: 3 10*3/uL (ref 0.7–4.0)
MCHC: 35 g/dL (ref 30.0–36.0)
MCV: 90.8 fl (ref 78.0–100.0)
MONO ABS: 1 10*3/uL (ref 0.1–1.0)
Monocytes Relative: 11.6 % (ref 3.0–12.0)
NEUTROS ABS: 4.4 10*3/uL (ref 1.4–7.7)
NEUTROS PCT: 51.3 % (ref 43.0–77.0)
PLATELETS: 261 10*3/uL (ref 150.0–400.0)
RBC: 4.97 Mil/uL (ref 4.22–5.81)
RDW: 12.6 % (ref 11.5–15.5)
WBC: 8.7 10*3/uL (ref 4.0–10.5)

## 2016-02-09 LAB — LIPID PANEL
Cholesterol: 190 mg/dL (ref 0–200)
HDL: 44.4 mg/dL (ref >39.00)
LDL Cholesterol: 117 mg/dL — ABNORMAL HIGH (ref 0–99)
NONHDL: 145.51
Total CHOL/HDL Ratio: 4
Triglycerides: 144 mg/dL (ref 0.0–149.0)
VLDL: 28.8 mg/dL (ref 0.0–40.0)

## 2016-02-09 LAB — PSA: PSA: 0.97 ng/mL (ref 0.10–4.00)

## 2016-02-09 LAB — HEPATIC FUNCTION PANEL
ALBUMIN: 4.6 g/dL (ref 3.5–5.2)
ALK PHOS: 69 U/L (ref 39–117)
ALT: 39 U/L (ref 0–53)
AST: 35 U/L (ref 0–37)
BILIRUBIN DIRECT: 0.1 mg/dL (ref 0.0–0.3)
TOTAL PROTEIN: 7.6 g/dL (ref 6.0–8.3)
Total Bilirubin: 0.5 mg/dL (ref 0.2–1.2)

## 2016-02-09 LAB — TSH: TSH: 1.15 u[IU]/mL (ref 0.35–4.50)

## 2016-02-09 LAB — URINALYSIS, ROUTINE W REFLEX MICROSCOPIC
Bilirubin Urine: NEGATIVE
HGB URINE DIPSTICK: NEGATIVE
Ketones, ur: NEGATIVE
LEUKOCYTES UA: NEGATIVE
Nitrite: NEGATIVE
PH: 7 (ref 5.0–8.0)
Specific Gravity, Urine: 1.015 (ref 1.000–1.030)
TOTAL PROTEIN, URINE-UPE24: NEGATIVE
URINE GLUCOSE: NEGATIVE
UROBILINOGEN UA: 0.2 (ref 0.0–1.0)

## 2016-02-09 LAB — BASIC METABOLIC PANEL
BUN: 12 mg/dL (ref 6–23)
CHLORIDE: 104 meq/L (ref 96–112)
CO2: 25 meq/L (ref 19–32)
Calcium: 9.7 mg/dL (ref 8.4–10.5)
Creatinine, Ser: 0.74 mg/dL (ref 0.40–1.50)
GFR: 122.69 mL/min (ref >60.00)
Glucose, Bld: 88 mg/dL (ref 70–99)
POTASSIUM: 4.3 meq/L (ref 3.5–5.1)
SODIUM: 140 meq/L (ref 135–145)

## 2016-02-09 MED ORDER — ROSUVASTATIN CALCIUM 20 MG PO TABS: 20.0000 mg | ORAL_TABLET | Freq: Every day | ORAL | 3 refills | Status: AC

## 2016-02-09 NOTE — Progress Notes (Signed)
Pre visit review using our clinic review tool, if applicable. No additional management support is needed unless otherwise documented below in the visit note. 

## 2016-02-09 NOTE — Patient Instructions (Addendum)
OK to take Mucinex otc for the ears  Please continue all other medications as before, and refills have been done if requested.  Please have the pharmacy call with any other refills you may need.  Please continue your efforts at being more active, low cholesterol diet, and weight control.  You are otherwise up to date with prevention measures today.  Please keep your appointments with your specialists as you may have planned  Please go to the LAB in the Basement (turn left off the elevator) for the tests to be done today  You will be contacted by phone if any changes need to be made immediately.  Otherwise, you will receive a letter about your results with an explanation, but please check with MyChart first.  Please remember to sign up for MyChart if you have not done so, as this will be important to you in the future with finding out test results, communicating by private email, and scheduling acute appointments online when needed.  Please return in 1 year for your yearly visit, or sooner if needed, with Lab testing done 3-5 days before

## 2016-02-09 NOTE — Progress Notes (Signed)
Subjective:    Patient ID: Sean Johnson, male    DOB: [birth year], 45 y.o.   MRN: [id number]  HPI  Here for wellness and f/u;  Overall doing ok;  Pt denies Chest pain, worsening SOB, DOE, wheezing, orthopnea, PND, worsening LE edema, palpitations, dizziness or syncope.  Pt denies neurological change such as new headache, facial or extremity weakness.  Pt denies polydipsia, polyuria, or low sugar symptoms. Pt states overall good compliance with treatment and medications, good tolerability, and has been trying to follow appropriate diet.  Pt denies worsening depressive symptoms, suicidal ideation or panic. No fever, night sweats, wt loss, loss of appetite, or other constitutional symptoms.  Pt states good ability with ADL's, has low fall risk, home safety reviewed and adequate, no other significant changes in hearing or vision, and not active with exercise. Friend with bad rxn to flu shot, declines today, but will get at [facility] per father pharmacist. He feels like he gained wt, but numbers are some improved   Wt Readings from Last 3 Encounters:  02/09/16 249 lb (112.9 kg)  10/01/14 250 lb (113.4 kg)  09/25/14 255 lb (115.7 kg)  Did have episode of extreme UE weakness after doing a workout.  Has con'td the lipitor but not done further exercise.  Would like change to crestor If possible. Also with bilat ear fullness, popping and crackling with intermittent muffling hearing at times for the past wk after recent URI several wks ago now without fever, HA, Sinus pain, ST or cough. Past Medical History:  Diagnosis Date  . ALLERGIC RHINITIS 07/24/2008  . ANXIETY 02/28/2007  . ASTHMA 07/24/2008  . CHEST PAIN 07/24/2008  . DEPRESSION 02/28/2007  . GERD 07/24/2008  . HYPERLIPIDEMIA 05/08/2008  . LIVER MASS 05/25/2009  . TRANSAMINASES, SERUM, ELEVATED 05/18/2009   Past Surgical History:  Procedure Laterality Date  . TONSILLECTOMY      reports that he has quit smoking. He does not have any smokeless tobacco  history on file. He reports that he drinks alcohol. His drug history is not on file. family history includes Alcohol abuse in his other; Cancer in his mother; Depression in his other; Stroke in his other. No Known Allergies Current Outpatient Prescriptions on File Prior to Visit  Medication Sig Dispense Refill  . fish oil-omega-3 fatty acids 1000 MG capsule Take 2 g by mouth daily.      . Multiple Vitamins-Minerals (MULTIVITAMIN,TX-MINERALS) tablet Take 1 tablet by mouth daily.      Marland Kitchen neomycin-polymyxin-hydrocortisone (CORTISPORIN) otic solution Place 3 drops into both ears 4 (four) times daily. 10 mL 0  . PARoxetine (PAXIL) 10 MG tablet TAKE 1 TABLET (10 MG TOTAL) BY MOUTH EVERY MORNING. 90 tablet 3   No current facility-administered medications on file prior to visit.    Review of Systems  Constitutional: Negative for unusual diaphoresis or night sweats HENT: Negative for ear swelling or discharge Eyes: Negative for worsening visual haziness  Respiratory: Negative for choking and stridor.   Gastrointestinal: Negative for distension or worsening eructation Genitourinary: Negative for retention or change in urine volume.  Musculoskeletal: Negative for other MSK pain or swelling Skin: Negative for color change and worsening wound Neurological: Negative for tremors and numbness other than noted  Psychiatric/Behavioral: Negative for decreased concentration or agitation other than above   All other system neg per pt    Objective:   Physical Exam BP 128/76   Pulse 72   Temp 98.2 F (36.8 C) (Oral)   Resp  20   Wt 249 lb (112.9 kg)   SpO2 96%   BMI 31.97 kg/m  VS noted,  Constitutional: Pt appears in no apparent distress HENT: Head: NCAT.  Right Ear: External ear normal.  Left Ear: External ear normal.  Eyes: . Pupils are equal, round, and reactive to light. Conjunctivae and EOM are normal Neck: Normal range of motion. Neck supple.  Cardiovascular: Normal rate and regular rhythm.    Pulmonary/Chest: Effort normal and breath sounds without rales or wheezing.  Bilat tm's with mild erythema.  Max sinus areas non tender.  Pharynx with mild erythema, no exudate Abd:  Soft, NT, ND, + BS Neurological: Pt is alert. Not confused , motor grossly intact Skin: Skin is warm. No rash, no LE edema Psychiatric: Pt behavior is normal. No agitation.  No other new exam findings    Assessment & Plan:

## 2016-02-13 DIAGNOSIS — H6983 Other specified disorders of Eustachian tube, bilateral: Secondary | ICD-10-CM | POA: Insufficient documentation

## 2016-02-13 NOTE — Assessment & Plan Note (Signed)

## 2016-02-13 NOTE — Assessment & Plan Note (Signed)
Mild, for mucinex prn,  to f/u any worsening symptoms or concerns

## 2016-10-21 ENCOUNTER — Other Ambulatory Visit: Payer: Self-pay | Admitting: Internal Medicine

## 2016-10-24 NOTE — Telephone Encounter (Signed)
Done erx 

## 2017-04-27 ENCOUNTER — Other Ambulatory Visit: Payer: Self-pay | Admitting: Internal Medicine
